# Patient Record
Sex: Female | Born: 1980 | Race: White | Hispanic: No | Marital: Single | Smoking: Current every day smoker
Health system: Southern US, Community
[De-identification: ages and names within clinical notes are randomized; demographics above are authoritative.]

## PROBLEM LIST (undated history)

## (undated) DIAGNOSIS — F32A Depression, unspecified: Secondary | ICD-10-CM

## (undated) DIAGNOSIS — F319 Bipolar disorder, unspecified: Secondary | ICD-10-CM

## (undated) DIAGNOSIS — F4310 Post-traumatic stress disorder, unspecified: Secondary | ICD-10-CM

## (undated) DIAGNOSIS — G5600 Carpal tunnel syndrome, unspecified upper limb: Secondary | ICD-10-CM

## (undated) DIAGNOSIS — N289 Disorder of kidney and ureter, unspecified: Secondary | ICD-10-CM

## (undated) DIAGNOSIS — N83209 Unspecified ovarian cyst, unspecified side: Secondary | ICD-10-CM

## (undated) DIAGNOSIS — F410 Panic disorder [episodic paroxysmal anxiety] without agoraphobia: Secondary | ICD-10-CM

## (undated) DIAGNOSIS — F209 Schizophrenia, unspecified: Secondary | ICD-10-CM

## (undated) DIAGNOSIS — F329 Major depressive disorder, single episode, unspecified: Secondary | ICD-10-CM

## (undated) HISTORY — DX: Major depressive disorder, single episode, unspecified: F32.9

## (undated) HISTORY — DX: Bipolar disorder, unspecified: F31.9

## (undated) HISTORY — DX: Depression, unspecified: F32.A

---

## 2001-06-16 ENCOUNTER — Inpatient Hospital Stay (HOSPITAL_COMMUNITY): Admission: EM | Admit: 2001-06-16 | Discharge: 2001-06-17 | Payer: Self-pay | Admitting: Emergency Medicine

## 2001-06-16 ENCOUNTER — Encounter: Payer: Self-pay | Admitting: Orthopaedic Surgery

## 2002-07-31 ENCOUNTER — Other Ambulatory Visit: Admission: RE | Admit: 2002-07-31 | Discharge: 2002-07-31 | Payer: Self-pay

## 2004-11-18 ENCOUNTER — Emergency Department (HOSPITAL_COMMUNITY): Admission: EM | Admit: 2004-11-18 | Discharge: 2004-11-18 | Payer: Self-pay | Admitting: Emergency Medicine

## 2005-02-26 ENCOUNTER — Emergency Department (HOSPITAL_COMMUNITY): Admission: EM | Admit: 2005-02-26 | Discharge: 2005-02-26 | Payer: Self-pay | Admitting: Emergency Medicine

## 2005-09-08 ENCOUNTER — Emergency Department (HOSPITAL_COMMUNITY): Admission: EM | Admit: 2005-09-08 | Discharge: 2005-09-08 | Payer: Self-pay | Admitting: Emergency Medicine

## 2008-01-26 ENCOUNTER — Ambulatory Visit (HOSPITAL_COMMUNITY): Admission: RE | Admit: 2008-01-26 | Discharge: 2008-01-26 | Payer: Self-pay | Admitting: Orthopaedic Surgery

## 2010-03-15 ENCOUNTER — Encounter: Payer: Self-pay | Admitting: Orthopaedic Surgery

## 2010-07-02 ENCOUNTER — Emergency Department (HOSPITAL_COMMUNITY)
Admission: EM | Admit: 2010-07-02 | Discharge: 2010-07-02 | Disposition: A | Discharge status: Home / Self Care | Payer: No Typology Code available for payment source | Attending: Emergency Medicine | Admitting: Emergency Medicine

## 2010-07-02 ENCOUNTER — Emergency Department (HOSPITAL_COMMUNITY): Payer: No Typology Code available for payment source

## 2010-07-02 DIAGNOSIS — M542 Cervicalgia: Secondary | ICD-10-CM | POA: Insufficient documentation

## 2010-07-02 DIAGNOSIS — Y92410 Unspecified street and highway as the place of occurrence of the external cause: Secondary | ICD-10-CM | POA: Insufficient documentation

## 2010-07-02 DIAGNOSIS — S99929A Unspecified injury of unspecified foot, initial encounter: Secondary | ICD-10-CM | POA: Insufficient documentation

## 2010-07-02 DIAGNOSIS — M25569 Pain in unspecified knee: Secondary | ICD-10-CM | POA: Insufficient documentation

## 2010-07-02 DIAGNOSIS — S8990XA Unspecified injury of unspecified lower leg, initial encounter: Secondary | ICD-10-CM | POA: Insufficient documentation

## 2010-07-02 DIAGNOSIS — IMO0002 Reserved for concepts with insufficient information to code with codable children: Secondary | ICD-10-CM | POA: Insufficient documentation
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
## 2010-07-21 ENCOUNTER — Emergency Department (HOSPITAL_COMMUNITY)
Admission: EM | Admit: 2010-07-21 | Discharge: 2010-07-21 | Disposition: A | Discharge status: Home / Self Care | Payer: No Typology Code available for payment source | Attending: Emergency Medicine | Admitting: Emergency Medicine

## 2010-07-21 DIAGNOSIS — M5412 Radiculopathy, cervical region: Secondary | ICD-10-CM | POA: Insufficient documentation

## 2010-07-22 ENCOUNTER — Ambulatory Visit (HOSPITAL_COMMUNITY)
Admit: 2010-07-22 | Discharge: 2010-07-22 | Disposition: A | Discharge status: Home / Self Care | Payer: Medicaid Other | Source: Ambulatory Visit | Attending: Emergency Medicine | Admitting: Emergency Medicine

## 2010-07-22 DIAGNOSIS — M25519 Pain in unspecified shoulder: Secondary | ICD-10-CM | POA: Insufficient documentation

## 2010-07-22 DIAGNOSIS — M5020 Other cervical disc displacement, unspecified cervical region: Secondary | ICD-10-CM | POA: Insufficient documentation

## 2010-07-22 DIAGNOSIS — M542 Cervicalgia: Secondary | ICD-10-CM | POA: Insufficient documentation

## 2010-07-23 ENCOUNTER — Other Ambulatory Visit (HOSPITAL_COMMUNITY): Payer: No Typology Code available for payment source

## 2011-02-10 ENCOUNTER — Encounter: Payer: Self-pay | Admitting: *Deleted

## 2011-02-10 ENCOUNTER — Emergency Department (HOSPITAL_COMMUNITY)
Admission: EM | Admit: 2011-02-10 | Discharge: 2011-02-10 | Disposition: A | Discharge status: Home / Self Care | Payer: No Typology Code available for payment source | Attending: Emergency Medicine | Admitting: Emergency Medicine

## 2011-02-10 DIAGNOSIS — IMO0002 Reserved for concepts with insufficient information to code with codable children: Secondary | ICD-10-CM | POA: Insufficient documentation

## 2011-02-10 DIAGNOSIS — M542 Cervicalgia: Secondary | ICD-10-CM | POA: Insufficient documentation

## 2011-02-10 DIAGNOSIS — M25519 Pain in unspecified shoulder: Secondary | ICD-10-CM | POA: Insufficient documentation

## 2011-02-10 DIAGNOSIS — F411 Generalized anxiety disorder: Secondary | ICD-10-CM | POA: Insufficient documentation

## 2011-02-10 DIAGNOSIS — M79609 Pain in unspecified limb: Secondary | ICD-10-CM | POA: Insufficient documentation

## 2011-02-10 DIAGNOSIS — M5412 Radiculopathy, cervical region: Secondary | ICD-10-CM | POA: Insufficient documentation

## 2011-02-10 DIAGNOSIS — F17200 Nicotine dependence, unspecified, uncomplicated: Secondary | ICD-10-CM | POA: Insufficient documentation

## 2011-02-10 NOTE — ED Provider Notes (Signed)
History     CSN: [id number] Arrival date & time: 02/10/2011 10:05 AM   First MD Initiated Contact with Patient 02/10/11 [phone]      Chief Complaint  Patient presents with  . Hand Pain    (Consider location/radiation/quality/duration/timing/severity/associated sxs/prior treatment) Patient is a 30 y.o. female presenting with hand pain. The history is provided by the patient.  Hand Pain    History reviewed. No pertinent past medical history.  History reviewed. No pertinent past surgical history.  No family history on file.  History  Substance Use Topics  . Smoking status: Current Everyday Smoker    Types: Cigarettes  . Smokeless tobacco: Not on file  . Alcohol Use: No    OB History    Grav Para Term Preterm Abortions TAB SAB Ect Mult Living                  Review of Systems  Allergies  Review of patient's allergies indicates no known allergies.  Home Medications  No current outpatient prescriptions on file.  BP 112/76  Pulse 111  Temp(Src) 98 F (36.7 C) (Oral)  Resp 16  Ht 5\' 4"  (1.626 m)  Wt 160 lb (72.576 kg)  BMI 27.46 kg/m2  SpO2 99%  LMP 01/27/2011  Physical Exam  Nursing note and vitals reviewed. Constitutional: She is oriented to person, place, and time. She appears well-developed and well-nourished.  Non-toxic appearance.  HENT:  Head: Normocephalic.  Right Ear: Tympanic membrane and external ear normal.  Left Ear: Tympanic membrane and external ear normal.  Eyes: EOM and lids are normal. Pupils are equal, round, and reactive to light.  Neck: Normal range of motion. Neck supple. Carotid bruit is not present.       Soreness with range of motion of the neck extending into the shoulders.  Cardiovascular: Normal rate, regular rhythm, normal heart sounds, intact distal pulses and normal pulses.   Pulmonary/Chest: Breath sounds normal. No respiratory distress.  Abdominal: Soft. Bowel sounds are normal. There is no tenderness. There is no guarding.   Musculoskeletal:       Soreness with attempted range of motion of the shoulders  Lymphadenopathy:       Head (right side): No submandibular adenopathy present.       Head (left side): No submandibular adenopathy present.    She has no cervical adenopathy.  Neurological: She is alert and oriented to person, place, and time. She has normal strength. No cranial nerve deficit or sensory deficit.       No gross neuro deficits appreciated at this time.  Skin: Skin is warm and dry.  Psychiatric: Her speech is normal.       Mildly agitated. Anxious    ED Course  Procedures (including critical care time)  Labs Reviewed - No data to display No results found.   No diagnosis found.    MDM  Patient reassured of stable vital signs. Reassured of stable examination at this time. Without gross neurologic deficits. Patient strongly encouraged to see a orthopedic specialist or neurosurgeon who can help her make the decisions for which procedures to have done next. Patient to continue current medications.    Kathie Dike, [place] 02/11/11 [phone]

## 2011-02-10 NOTE — ED Notes (Signed)
MD at bedside. 
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
## 2011-02-17 NOTE — ED Provider Notes (Signed)
Medical screening examination/treatment/procedure(s) were performed by non-physician practitioner and as supervising physician I was immediately available for consultation/collaboration.  Mallorie Norrod, MD 02/17/11 0130 

## 2011-05-28 ENCOUNTER — Encounter (HOSPITAL_COMMUNITY): Payer: Self-pay

## 2011-05-28 ENCOUNTER — Emergency Department (HOSPITAL_COMMUNITY)
Admission: EM | Admit: 2011-05-28 | Discharge: 2011-05-29 | Disposition: A | Discharge status: Home / Self Care | Payer: Medicaid Other | Attending: Emergency Medicine | Admitting: Emergency Medicine

## 2011-05-28 DIAGNOSIS — Z79899 Other long term (current) drug therapy: Secondary | ICD-10-CM | POA: Insufficient documentation

## 2011-05-28 DIAGNOSIS — R109 Unspecified abdominal pain: Secondary | ICD-10-CM

## 2011-05-28 LAB — URINALYSIS, ROUTINE W REFLEX MICROSCOPIC
Bilirubin Urine: NEGATIVE
Hgb urine dipstick: NEGATIVE
Ketones, ur: NEGATIVE mg/dL
Nitrite: NEGATIVE
pH: 6 (ref 5.0–8.0)

## 2011-05-28 NOTE — ED Notes (Signed)
Pt presents with bilateral flank pain that radiates to abdomen. Pt also states she feels like something is scratching her when she cleans herself after using the restroom.

## 2011-05-29 ENCOUNTER — Emergency Department (HOSPITAL_COMMUNITY): Payer: Medicaid Other

## 2011-05-29 MED ORDER — HYDROCODONE-ACETAMINOPHEN 5-325 MG PO TABS
2.0000 | ORAL_TABLET | Freq: Once | ORAL | Status: AC
  Administered 2011-05-29: 2 via ORAL
  Filled 2011-05-29 (×2): qty 2

## 2011-05-29 MED ORDER — ONDANSETRON 4 MG PO TBDP
4.0000 mg | ORAL_TABLET | Freq: Once | ORAL | Status: AC
  Administered 2011-05-29: 4 mg via ORAL
  Filled 2011-05-29: qty 1

## 2011-05-29 MED ORDER — HYDROCODONE-ACETAMINOPHEN 5-500 MG PO TABS: 1.0000 | ORAL_TABLET | Freq: Four times a day (QID) | ORAL | Status: AC | PRN

## 2011-05-29 MED ORDER — ONDANSETRON 4 MG PO TBDP
ORAL_TABLET | ORAL | Status: AC
  Filled 2011-05-29: qty 1

## 2011-05-29 MED ORDER — KETOROLAC TROMETHAMINE 60 MG/2ML IM SOLN
60.0000 mg | Freq: Once | INTRAMUSCULAR | Status: AC
  Administered 2011-05-29: 60 mg via INTRAMUSCULAR
  Filled 2011-05-29: qty 2

## 2011-05-29 NOTE — ED Notes (Signed)
edp in to see pt 

## 2011-05-29 NOTE — Discharge Instructions (Signed)

## 2011-05-29 NOTE — ED Provider Notes (Signed)
History     CSN: [id number]  Arrival date & time 05/28/11  2246   First MD Initiated Contact with Patient 05/28/11 2348      Chief Complaint  Patient presents with  . Flank Pain    (Consider location/radiation/quality/duration/timing/severity/associated sxs/prior treatment) HPI Comments: Pain is in the left flank and lower back.  Concerned she may have a kidney stone.  Her mom gets these.  No vaginal discharge.  LMP last month and normal, denies possibility of pregnancy.  Patient is a 31 y.o. female presenting with flank pain. The history is provided by the patient.  Flank Pain This is a new problem. The current episode started 2 days ago. The problem occurs constantly. The problem has been gradually worsening. Associated symptoms include abdominal pain. Pertinent negatives include no chest pain. The symptoms are aggravated by nothing. The symptoms are relieved by nothing.    History reviewed. No pertinent past medical history.  History reviewed. No pertinent past surgical history.  No family history on file.  History  Substance Use Topics  . Smoking status: Current Everyday Smoker    Types: Cigarettes  . Smokeless tobacco: Not on file  . Alcohol Use: No    OB History    Grav Para Term Preterm Abortions TAB SAB Ect Mult Living                  Review of Systems  Cardiovascular: Negative for chest pain.  Gastrointestinal: Positive for abdominal pain.  Genitourinary: Positive for flank pain.  All other systems reviewed and are negative.    Allergies  Review of patient's allergies indicates no known allergies.  Home Medications   Current Outpatient Rx  Name Route Sig Dispense Refill  . AMPHETAMINE-DEXTROAMPHETAMINE 20 MG PO TABS Oral Take 20 mg by mouth 2 (two) times daily.      Marland Kitchen DIAZEPAM 10 MG PO TABS Oral Take 10 mg by mouth every 6 (six) hours as needed. Anxiety     . FLUOXETINE HCL 20 MG PO CAPS Oral Take 20 mg by mouth daily.      Marland Kitchen  HYDROCODONE-ACETAMINOPHEN 7.5-325 MG PO TABS Oral Take 1 tablet by mouth every 6 (six) hours as needed. Pain     . ZIPRASIDONE HCL 20 MG PO CAPS Oral Take 20 mg by mouth daily.        BP 103/62  Pulse 100  Temp(Src) 98.1 F (36.7 C) (Oral)  Resp 20  Ht 5\' 6"  (1.676 m)  Wt 160 lb (72.576 kg)  BMI 25.82 kg/m2  SpO2 100%  LMP 05/03/2011  Physical Exam  Nursing note and vitals reviewed. Constitutional: She is oriented to person, place, and time. She appears well-developed and well-nourished. No distress.  HENT:  Head: Normocephalic and atraumatic.  Neck: Normal range of motion. Neck supple.  Cardiovascular: Normal rate and regular rhythm.   No murmur heard. Pulmonary/Chest: Effort normal and breath sounds normal. No respiratory distress.  Abdominal: Soft. Bowel sounds are normal. She exhibits no distension. There is tenderness.       There is mild ttp in the left upper and lower abdomen.  Mild left cva ttp.  Musculoskeletal: Normal range of motion. She exhibits no edema.  Neurological: She is alert and oriented to person, place, and time.  Skin: Skin is warm and dry. She is not diaphoretic.    ED Course  Procedures (including critical care time)   Labs Reviewed  URINALYSIS, ROUTINE W REFLEX MICROSCOPIC  PREGNANCY, URINE   No  results found.   No diagnosis found.    MDM  The ua and ct look okay.  I have not found an exact cause for the patient's symptoms, but they do not seem emergent.  Will discharge with pain meds, return prn.  She reports issues with her ovaries which I have advised her to see Gyn in follow up.          Geoffery Lyons, MD 05/29/11 0201

## 2011-11-04 ENCOUNTER — Encounter (HOSPITAL_COMMUNITY): Payer: Self-pay

## 2011-11-04 ENCOUNTER — Emergency Department (HOSPITAL_COMMUNITY)
Admission: EM | Admit: 2011-11-04 | Discharge: 2011-11-04 | Disposition: A | Discharge status: Home / Self Care | Payer: Medicaid Other | Attending: Emergency Medicine | Admitting: Emergency Medicine

## 2011-11-04 DIAGNOSIS — H6692 Otitis media, unspecified, left ear: Secondary | ICD-10-CM

## 2011-11-04 DIAGNOSIS — H6690 Otitis media, unspecified, unspecified ear: Secondary | ICD-10-CM | POA: Insufficient documentation

## 2011-11-04 DIAGNOSIS — F17200 Nicotine dependence, unspecified, uncomplicated: Secondary | ICD-10-CM | POA: Insufficient documentation

## 2011-11-04 LAB — URINALYSIS, ROUTINE W REFLEX MICROSCOPIC
Glucose, UA: NEGATIVE mg/dL
Leukocytes, UA: NEGATIVE
Protein, ur: NEGATIVE mg/dL
Specific Gravity, Urine: >1.03 — ABNORMAL HIGH (ref 1.005–1.030)
pH: 6 (ref 5.0–8.0)

## 2011-11-04 LAB — URINE MICROSCOPIC-ADD ON

## 2011-11-04 LAB — PREGNANCY, URINE: Preg Test, Ur: NEGATIVE

## 2011-11-04 MED ORDER — FLUCONAZOLE 200 MG PO TABS: 200.0000 mg | ORAL_TABLET | Freq: Every day | ORAL | Status: AC

## 2011-11-04 MED ORDER — AMOXICILLIN 500 MG PO CAPS: 500.0000 mg | ORAL_CAPSULE | Freq: Three times a day (TID) | ORAL | Status: AC

## 2011-11-04 MED ORDER — AMOXICILLIN 250 MG PO CAPS
500.0000 mg | ORAL_CAPSULE | Freq: Once | ORAL | Status: AC
  Administered 2011-11-04: 500 mg via ORAL
  Filled 2011-11-04: qty 2

## 2011-11-04 NOTE — ED Notes (Signed)
Pt also reports that she "only spotted a few days last month" and wants apregnancy test.

## 2011-11-04 NOTE — ED Provider Notes (Signed)
History     CSN: [id number]  Arrival date & time 11/04/11  1035   First MD Initiated Contact with Patient 11/04/11 1057      Chief Complaint  Patient presents with  . Otalgia  . Dysuria    (Consider location/radiation/quality/duration/timing/severity/associated sxs/prior treatment) HPI Comments: L ear pain x 2 days and "difficulty urinating" x 3 days.  No fever or chills.  No dysuria.  ? Hematuria.  "ready to start my period".  No frequency.  No urgency.  + hesitation.  No back.  Patient is a 31 y.o. female presenting with ear pain and dysuria. The history is provided by the patient. No language interpreter was used.  Otalgia This is a new problem. The current episode started 2 days ago. There is pain in the left ear. The problem occurs constantly. The problem has not changed since onset.There has been no fever. Pertinent negatives include no ear discharge, no headaches, no hearing loss, no sore throat, no cough and no rash. Her past medical history does not include chronic ear infection, hearing loss or tympanostomy tube.  Dysuria  Pertinent negatives include no chills, no frequency, no hematuria and no urgency.    History reviewed. No pertinent past medical history.  History reviewed. No pertinent past surgical history.  No family history on file.  History  Substance Use Topics  . Smoking status: Current Every Day Smoker    Types: Cigarettes  . Smokeless tobacco: Not on file  . Alcohol Use: No    OB History    Grav Para Term Preterm Abortions TAB SAB Ect Mult Living                  Review of Systems  Constitutional: Negative for fever and chills.  HENT: Positive for ear pain. Negative for hearing loss, sore throat and ear discharge.   Respiratory: Negative for cough.   Genitourinary: Positive for vaginal bleeding. Negative for dysuria, urgency, frequency, hematuria, decreased urine volume, vaginal discharge, vaginal pain and pelvic pain.  Musculoskeletal: Negative  for back pain.  Skin: Negative for rash.  Neurological: Negative for headaches.  All other systems reviewed and are negative.    Allergies  Review of patient's allergies indicates no known allergies.  Home Medications   Current Outpatient Rx  Name Route Sig Dispense Refill  . AMOXICILLIN 500 MG PO CAPS Oral Take 1 capsule (500 mg total) by mouth 3 (three) times daily. 30 capsule 0  . AMPHETAMINE-DEXTROAMPHETAMINE 20 MG PO TABS Oral Take 20 mg by mouth 2 (two) times daily.      Marland Kitchen DIAZEPAM 10 MG PO TABS Oral Take 10 mg by mouth every 6 (six) hours as needed. Anxiety     . FLUCONAZOLE 200 MG PO TABS Oral Take 1 tablet (200 mg total) by mouth daily. 1 tablet 1  . FLUOXETINE HCL 20 MG PO CAPS Oral Take 20 mg by mouth daily.      Marland Kitchen HYDROCODONE-ACETAMINOPHEN 7.5-325 MG PO TABS Oral Take 1 tablet by mouth every 6 (six) hours as needed. Pain     . ZIPRASIDONE HCL 20 MG PO CAPS Oral Take 20 mg by mouth daily.        BP 119/79  Pulse 98  Temp 97.7 F (36.5 C) (Oral)  Resp 18  SpO2 99%  LMP 10/13/2011  Physical Exam  Nursing note and vitals reviewed. Constitutional: She is oriented to person, place, and time. She appears well-developed and well-nourished. No distress.  HENT:  Head: Normocephalic  and atraumatic.  Right Ear: Hearing, tympanic membrane, external ear and ear canal normal.  Left Ear: Hearing, external ear and ear canal normal. No drainage. Tympanic membrane is erythematous and bulging. A middle ear effusion is present. No decreased hearing is noted.  Eyes: EOM are normal.  Neck: Normal range of motion.  Cardiovascular: Normal rate, regular rhythm and normal heart sounds.   Pulmonary/Chest: Effort normal and breath sounds normal.  Abdominal: Soft. Normal appearance. She exhibits no distension. There is no tenderness. There is no CVA tenderness.  Musculoskeletal: Normal range of motion.  Neurological: She is alert and oriented to person, place, and time.  Skin: Skin is warm  and dry.  Psychiatric: She has a normal mood and affect. Judgment normal.    ED Course  Procedures (including critical care time)  Labs Reviewed  URINALYSIS, ROUTINE W REFLEX MICROSCOPIC - Abnormal; Notable for the following:    Specific Gravity, Urine >1.030 (*)     Hgb urine dipstick TRACE (*)     All other components within normal limits  URINE MICROSCOPIC-ADD ON - Abnormal; Notable for the following:    Squamous Epithelial / LPF FEW (*)     Crystals CA OXALATE CRYSTALS (*)     All other components within normal limits  PREGNANCY, URINE   No results found.   1. Left otitis media       MDM  rx-amoxicillin 500 mg TID x 10 day rx-fluconazole 200 mg, 1 tab        Evalina Field, PA 11/04/11 1143

## 2011-11-04 NOTE — ED Notes (Signed)
Pt states she has had an ear ache , and URI symptoms x 3 days.  Nasal congestion audible with noted clear drainage. Pt denies fever and cough at this time. Pt also requests pain medication at this time. Pt was advised to take motrin 800 mg 3 x's a day with food. Pt states needs something stronger. Advised pt to see family MD if stronger medication is needed.

## 2011-11-04 NOTE — ED Notes (Signed)
1. Pt reports left earache for 2 days 2. Painful urination for 3 days.

## 2011-11-04 NOTE — ED Provider Notes (Signed)
Medical screening examination/treatment/procedure(s) were performed by non-physician practitioner and as supervising physician I was immediately available for consultation/collaboration.   Nami Strawder L Fate Caster, MD 11/04/11 1451 

## 2011-12-20 ENCOUNTER — Encounter (HOSPITAL_COMMUNITY): Payer: Self-pay

## 2011-12-20 ENCOUNTER — Emergency Department (HOSPITAL_COMMUNITY)
Admission: EM | Admit: 2011-12-20 | Discharge: 2011-12-21 | Disposition: A | Discharge status: Home / Self Care | Payer: Medicaid Other | Attending: Emergency Medicine | Admitting: Emergency Medicine

## 2011-12-20 ENCOUNTER — Emergency Department (HOSPITAL_COMMUNITY): Payer: Medicaid Other

## 2011-12-20 DIAGNOSIS — Z8742 Personal history of other diseases of the female genital tract: Secondary | ICD-10-CM | POA: Insufficient documentation

## 2011-12-20 DIAGNOSIS — Z79899 Other long term (current) drug therapy: Secondary | ICD-10-CM | POA: Insufficient documentation

## 2011-12-20 DIAGNOSIS — N949 Unspecified condition associated with female genital organs and menstrual cycle: Secondary | ICD-10-CM | POA: Insufficient documentation

## 2011-12-20 DIAGNOSIS — N72 Inflammatory disease of cervix uteri: Secondary | ICD-10-CM | POA: Insufficient documentation

## 2011-12-20 DIAGNOSIS — G5600 Carpal tunnel syndrome, unspecified upper limb: Secondary | ICD-10-CM | POA: Insufficient documentation

## 2011-12-20 DIAGNOSIS — F17200 Nicotine dependence, unspecified, uncomplicated: Secondary | ICD-10-CM | POA: Insufficient documentation

## 2011-12-20 DIAGNOSIS — R102 Pelvic and perineal pain: Secondary | ICD-10-CM

## 2011-12-20 HISTORY — DX: Carpal tunnel syndrome, unspecified upper limb: G56.00

## 2011-12-20 HISTORY — DX: Unspecified ovarian cyst, unspecified side: N83.209

## 2011-12-20 LAB — URINALYSIS, ROUTINE W REFLEX MICROSCOPIC
Bilirubin Urine: NEGATIVE
Glucose, UA: NEGATIVE mg/dL
Hgb urine dipstick: NEGATIVE
Ketones, ur: NEGATIVE mg/dL
Protein, ur: NEGATIVE mg/dL
Urobilinogen, UA: 0.2 mg/dL (ref 0.0–1.0)

## 2011-12-20 LAB — WET PREP, GENITAL
Clue Cells Wet Prep HPF POC: NONE SEEN
Trich, Wet Prep: NONE SEEN
Yeast Wet Prep HPF POC: NONE SEEN

## 2011-12-20 MED ORDER — KETOROLAC TROMETHAMINE 60 MG/2ML IM SOLN
60.0000 mg | Freq: Once | INTRAMUSCULAR | Status: AC
  Administered 2011-12-20: 60 mg via INTRAMUSCULAR
  Filled 2011-12-20: qty 2

## 2011-12-20 MED ORDER — CEFTRIAXONE SODIUM 250 MG IJ SOLR
250.0000 mg | Freq: Once | INTRAMUSCULAR | Status: AC
  Administered 2011-12-20: 250 mg via INTRAMUSCULAR
  Filled 2011-12-20: qty 250

## 2011-12-20 MED ORDER — AZITHROMYCIN 250 MG PO TABS
1000.0000 mg | ORAL_TABLET | Freq: Once | ORAL | Status: AC
  Administered 2011-12-20: 1000 mg via ORAL
  Filled 2011-12-20: qty 4

## 2011-12-20 MED ORDER — HYDROCODONE-ACETAMINOPHEN 5-325 MG PO TABS: ORAL_TABLET | ORAL | Status: DC

## 2011-12-20 MED ORDER — ONDANSETRON 8 MG PO TBDP
8.0000 mg | ORAL_TABLET | Freq: Once | ORAL | Status: AC
  Administered 2011-12-20: 8 mg via ORAL
  Filled 2011-12-20: qty 1

## 2011-12-20 MED ORDER — NAPROXEN 250 MG PO TABS: 250.0000 mg | ORAL_TABLET | Freq: Two times a day (BID) | ORAL | Status: DC

## 2011-12-20 MED ORDER — LIDOCAINE HCL (PF) 1 % IJ SOLN
INTRAMUSCULAR | Status: AC
  Administered 2011-12-20: 5 mL
  Filled 2011-12-20: qty 5

## 2011-12-20 NOTE — ED Notes (Signed)
Having ovarian cyst problems and hurting in my lower pelvic area. Having an odor and some urinary problems per pt.

## 2011-12-20 NOTE — ED Provider Notes (Signed)
History     CSN: [id number]  Arrival date & time 12/20/11  1851   First MD Initiated Contact with Patient 12/20/11 1955      Chief Complaint  Patient presents with  . Ovarian Cyst  . Pelvic Pain     HPI Pt was seen at 2010.  Per pt, c/o gradual onset and persistence of constant lower pelvic "pain" for the past 2 to 3 weeks.  Has been associated with "green" vaginal discharge "with an odor."  Denies flank pain, no fevers, no N/V/D, no rash, no dysuria, no vaginal bleeding, no CP/SOB.    Past Medical History  Diagnosis Date  . Ovarian cyst   . Ovarian cyst rupture   . Carpal tunnel syndrome     History reviewed. No pertinent past surgical history.   History  Substance Use Topics  . Smoking status: Current Every Day Smoker -- 0.5 packs/day    Types: Cigarettes  . Smokeless tobacco: Not on file  . Alcohol Use: No    Review of Systems ROS: Statement: All systems negative except as marked or noted in the HPI; Constitutional: Negative for fever and chills. ; ; Eyes: Negative for eye pain, redness and discharge. ; ; ENMT: Negative for ear pain, hoarseness, nasal congestion, sinus pressure and sore throat. ; ; Cardiovascular: Negative for chest pain, palpitations, diaphoresis, dyspnea and peripheral edema. ; ; Respiratory: Negative for cough, wheezing and stridor. ; ; Gastrointestinal: Negative for nausea, vomiting, diarrhea, abdominal pain, blood in stool, hematemesis, jaundice and rectal bleeding. . ; ; Genitourinary: Negative for dysuria, flank pain and hematuria. ; GYN:  No vaginal bleeding, +pelvic pain, +vaginal discharge, no vulvar pain.; ; Musculoskeletal: Negative for back pain and neck pain. Negative for swelling and trauma.; ; Skin: Negative for pruritus, rash, abrasions, blisters, bruising and skin lesion.; ; Neuro: Negative for headache, lightheadedness and neck stiffness. Negative for weakness, altered level of consciousness , altered mental status, extremity weakness,  paresthesias, involuntary movement, seizure and syncope.       Allergies  Review of patient's allergies indicates no known allergies.  Home Medications   Current Outpatient Rx  Name Route Sig Dispense Refill  . AMPHETAMINE-DEXTROAMPHETAMINE 20 MG PO TABS Oral Take 20 mg by mouth 2 (two) times daily.      Marland Kitchen DIAZEPAM 10 MG PO TABS Oral Take 10 mg by mouth every 6 (six) hours as needed. Anxiety     . FLUOXETINE HCL 20 MG PO CAPS Oral Take 20 mg by mouth daily.      Marland Kitchen HYDROCODONE-ACETAMINOPHEN 7.5-325 MG PO TABS Oral Take 1 tablet by mouth every 6 (six) hours as needed. Pain     . ZIPRASIDONE HCL 20 MG PO CAPS Oral Take 20 mg by mouth daily.        BP 130/69  Pulse 100  Temp 98.2 F (36.8 C) (Oral)  Resp 20  Ht 5\' 6"  (1.676 m)  Wt 150 lb (68.04 kg)  BMI 24.21 kg/m2  SpO2 100%  LMP 09/29/2011  Physical Exam 2015: Physical examination:  Nursing notes reviewed; Vital signs and O2 SAT reviewed;  Constitutional: Well developed, Well nourished, Well hydrated, In no acute distress; Head:  Normocephalic, atraumatic; Eyes: EOMI, PERRL, No scleral icterus; ENMT: Mouth and pharynx normal, Mucous membranes moist; Neck: Supple, Full range of motion, No lymphadenopathy; Cardiovascular: Regular rate and rhythm, No murmur, rub, or gallop; Respiratory: Breath sounds clear & equal bilaterally, No rales, rhonchi, wheezes.  Speaking full sentences with ease, Normal respiratory  effort/excursion; Chest: Nontender, Movement normal; Abdomen: Soft, +LLQ, suprapubic, RLQ tender to palp. No rebound or guarding. Nondistended, Normal bowel sounds; Genitourinary: No CVA tenderness. Pelvic exam performed with permission of pt and female ED tech assist during exam.  External genitalia w/o lesions. Vaginal vault with thick while discharge, no purulence.  Cervix w/o lesions, not friable, GC/chlam and wet prep obtained and sent to lab.  Bimanual exam w/o CMT or adnexal tenderness, +suprapubic tenderness.;;; Extremities:  Pulses normal, No tenderness, No edema, No calf edema or asymmetry.; Neuro: AA&Ox3, Major CN grossly intact.  Speech clear. No gross focal motor or sensory deficits in extremities.; Skin: Color normal, Warm, Dry.   ED Course  Procedures    MDM  MDM Reviewed: nursing note, vitals and previous chart Interpretation: labs and ultrasound   Results for orders placed during the hospital encounter of 12/20/11  URINALYSIS, ROUTINE W REFLEX MICROSCOPIC      Component Value Range   Color, Urine YELLOW  YELLOW   APPearance CLEAR  CLEAR   Specific Gravity, Urine 1.020  1.005 - 1.030   pH 6.5  5.0 - 8.0   Glucose, UA NEGATIVE  NEGATIVE mg/dL   Hgb urine dipstick NEGATIVE  NEGATIVE   Bilirubin Urine NEGATIVE  NEGATIVE   Ketones, ur NEGATIVE  NEGATIVE mg/dL   Protein, ur NEGATIVE  NEGATIVE mg/dL   Urobilinogen, UA 0.2  0.0 - 1.0 mg/dL   Nitrite NEGATIVE  NEGATIVE   Leukocytes, UA NEGATIVE  NEGATIVE  PREGNANCY, URINE      Component Value Range   Preg Test, Ur NEGATIVE  NEGATIVE  WET PREP, GENITAL      Component Value Range   Yeast Wet Prep HPF POC NONE SEEN  NONE SEEN   Trich, Wet Prep NONE SEEN  NONE SEEN   Clue Cells Wet Prep HPF POC NONE SEEN  NONE SEEN   WBC, Wet Prep HPF POC RARE (*) NONE SEEN    US Pelvis Complete 12/20/2011  *RADIOLOGY REPORT*  Clinical Data:  Pelvic pain.  Rule out abscess or torsion.  TRANSABDOMINAL AND TRANSVAGINAL ULTRASOUND OF PELVIS DOPPLER ULTRASOUND OF OVARIES  Technique:  Both transabdominal and transvaginal ultrasound examinations of the pelvis were performed. Transabdominal technique was performed for global imaging of the pelvis including uterus, ovaries, adnexal regions, and pelvic cul-de-sac.  It was necessary to proceed with endovaginal exam following the transabdominal exam to visualize the ovaries.  Color and duplex Doppler ultrasound was utilized to evaluate blood flow to the ovaries.  Comparison:  CT of the abdomen pelvis 05/29/2011 at [facility].  Findings:  Uterus:  The uterus is of normal size and echotexture measuring 8.2 x 4.2 x 4.8 cm.  No significant mass lesion is present.  Endometrium:  Endometrium is within normal limits measuring 4 mm. Subendometrial cyst measures 5 mm.  Right ovary: The right ovary is of normal size and echotexture measuring 3.9 x 1.4 x 3.7 cm.  Normal color Doppler flow is present.  Arterial and venous waveforms are normal.  Left ovary:   The left ovary is of normal size and echotexture, measuring 3.0 x 2.2 x 2.8 cm.  Normal color Doppler flow is present.  Arterial venous waveforms are normal.  Pulsed Doppler evaluation demonstrates normal low-resistance arterial and venous waveforms in both ovaries.  A small amount of free fluid is present adjacent to the lower uterine segment.  IMPRESSION:  1.  Normal appearance of the uterus and ovaries. 2.  Normal appearance of the  follicles bilaterally. 3.  Normal color Doppler analysis and wave forms without evidence for ovarian torsion. 4.  A small amount of free fluid is evident.   Original Report Authenticated By: Jamesetta Orleans. MATTERN, M.D.       2330:  No acute findings on workup today.  Will treat for cervicitis while GC/chlam pending.  Dx and testing d/w pt and family.  Questions answered.  Verb understanding, agreeable to d/c home with outpt f/u.       Laray Anger, DO 12/23/11 2056

## 2012-02-11 ENCOUNTER — Emergency Department (HOSPITAL_COMMUNITY)
Admission: EM | Admit: 2012-02-11 | Discharge: 2012-02-11 | Disposition: A | Discharge status: Home / Self Care | Payer: Medicaid Other | Attending: Emergency Medicine | Admitting: Emergency Medicine

## 2012-02-11 ENCOUNTER — Encounter (HOSPITAL_COMMUNITY): Payer: Self-pay | Admitting: *Deleted

## 2012-02-11 DIAGNOSIS — F319 Bipolar disorder, unspecified: Secondary | ICD-10-CM | POA: Insufficient documentation

## 2012-02-11 DIAGNOSIS — Z79899 Other long term (current) drug therapy: Secondary | ICD-10-CM | POA: Insufficient documentation

## 2012-02-11 DIAGNOSIS — G5600 Carpal tunnel syndrome, unspecified upper limb: Secondary | ICD-10-CM | POA: Insufficient documentation

## 2012-02-11 DIAGNOSIS — F17200 Nicotine dependence, unspecified, uncomplicated: Secondary | ICD-10-CM | POA: Insufficient documentation

## 2012-02-11 DIAGNOSIS — R109 Unspecified abdominal pain: Secondary | ICD-10-CM | POA: Insufficient documentation

## 2012-02-11 DIAGNOSIS — Z8742 Personal history of other diseases of the female genital tract: Secondary | ICD-10-CM | POA: Insufficient documentation

## 2012-02-11 DIAGNOSIS — N390 Urinary tract infection, site not specified: Secondary | ICD-10-CM

## 2012-02-11 HISTORY — DX: Bipolar disorder, unspecified: F31.9

## 2012-02-11 LAB — URINE MICROSCOPIC-ADD ON

## 2012-02-11 LAB — URINALYSIS, ROUTINE W REFLEX MICROSCOPIC
Glucose, UA: 250 mg/dL — AB
Specific Gravity, Urine: 1.025 (ref 1.005–1.030)
pH: 5 (ref 5.0–8.0)

## 2012-02-11 MED ORDER — CIPROFLOXACIN HCL 500 MG PO TABS: 500.0000 mg | ORAL_TABLET | Freq: Two times a day (BID) | ORAL | Status: DC

## 2012-02-11 MED ORDER — OXYCODONE-ACETAMINOPHEN 5-325 MG PO TABS
2.0000 | ORAL_TABLET | Freq: Once | ORAL | Status: AC
  Administered 2012-02-11: 2 via ORAL
  Filled 2012-02-11: qty 2

## 2012-02-11 MED ORDER — OXYCODONE-ACETAMINOPHEN 5-325 MG PO TABS: 1.0000 | ORAL_TABLET | Freq: Four times a day (QID) | ORAL | Status: DC | PRN

## 2012-02-11 NOTE — ED Provider Notes (Signed)
History  This chart was scribed for Geoffery Lyons, MD by Bennett Scrape, ED Scribe. This patient was seen in room APA03/APA03 and the patient's care was started at 4:05 PM.  CSN: [id number]  Arrival date & time 02/11/12  1519   First MD Initiated Contact with Patient 02/11/12 1605      Chief Complaint  Patient presents with  . Dysuria  . Flank Pain     The history is provided by the patient. No language interpreter was used.   Martha Cochran is a 31 y.o. female who presents to the Emergency Department complaining of 4 to 5 days of gradual onset, gradually worsening, constant right flank pain that radiates into her bilateral suprapubic region with associated difficulty urinating, dysuria described pressure and night sweats. She rates her pain as severe. She reports taking her boyfriend's oxycodone with mild improvement in her pain. She denies fevers, nausea and emesis as associated symptoms. She has a h/o bipolar disorder, ovarian cysts and carpal tunnel. She is a current everyday smoker but denies alcohol use.  Past Medical History  Diagnosis Date  . Ovarian cyst   . Ovarian cyst rupture   . Carpal tunnel syndrome   . Bipolar 1 disorder     History reviewed. No pertinent past surgical history.  No family history on file.  History  Substance Use Topics  . Smoking status: Current Every Day Smoker -- 0.5 packs/day    Types: Cigarettes  . Smokeless tobacco: Not on file  . Alcohol Use: No    No OB history provided.   Review of Systems  Constitutional: Negative for fever and chills.  Cardiovascular: Negative for chest pain.  Gastrointestinal: Negative for nausea, vomiting, abdominal pain and diarrhea.  Genitourinary: Positive for dysuria, flank pain and difficulty urinating. Negative for frequency and hematuria.  All other systems reviewed and are negative.    Allergies  Review of patient's allergies indicates no known allergies.  Home Medications   Current  Outpatient Rx  Name  Route  Sig  Dispense  Refill  . ALBUTEROL SULFATE HFA 108 (90 BASE) MCG/ACT IN AERS   Inhalation   Inhale 2 puffs into the lungs every 6 (six) hours as needed. For shortness of breath         . AMPHETAMINE-DEXTROAMPHETAMINE 20 MG PO TABS   Oral   Take 20 mg by mouth 2 (two) times daily.           Marland Kitchen DIAZEPAM 10 MG PO TABS   Oral   Take 10 mg by mouth every 6 (six) hours as needed. Anxiety          . FLUCONAZOLE 150 MG PO TABS   Oral   Take 150 mg by mouth once.         Marland Kitchen FLUOXETINE HCL 20 MG PO CAPS   Oral   Take 20 mg by mouth daily.           Marland Kitchen HYDROCODONE-ACETAMINOPHEN 7.5-325 MG PO TABS   Oral   Take 1 tablet by mouth every 6 (six) hours as needed. Pain          . HYDROCODONE-ACETAMINOPHEN 5-325 MG PO TABS      1 or 2 tabs PO q6 hours prn pain   20 tablet   0   . NAPROXEN 250 MG PO TABS   Oral   Take 1 tablet (250 mg total) by mouth 2 (two) times daily with a meal.   14 tablet  0   . UNKNOWN TO PATIENT   Oral   Take 2 tablets by mouth once as needed. ANTIBIOTIC-NAME UNKNOWN         . ZIPRASIDONE HCL 20 MG PO CAPS   Oral   Take 20 mg by mouth daily.             Triage Vitals: BP 103/66  Pulse 106  Temp 97.3 F (36.3 C)  Resp 20  Ht 5\' 6"  (1.676 m)  Wt 155 lb (70.308 kg)  BMI 25.02 kg/m2  SpO2 100%  LMP 11/12/2011  Physical Exam  Nursing note and vitals reviewed. Constitutional: She is oriented to person, place, and time. She appears well-developed and well-nourished. No distress.  HENT:  Head: Normocephalic and atraumatic.  Mouth/Throat: Oropharynx is clear and moist.  Eyes: Conjunctivae normal and EOM are normal. Pupils are equal, round, and reactive to light.  Neck: Neck supple. No tracheal deviation present.  Cardiovascular: Normal rate and regular rhythm.   Pulmonary/Chest: Effort normal and breath sounds normal. No respiratory distress.  Abdominal: Soft. There is tenderness (tenderness to palpation in  the suprapubic region and right flank). There is no rebound and no guarding.  Musculoskeletal: Normal range of motion. She exhibits no edema.  Neurological: She is alert and oriented to person, place, and time.  Skin: Skin is warm and dry.  Psychiatric: She has a normal mood and affect. Her behavior is normal.    ED Course  Procedures (including critical care time)  DIAGNOSTIC STUDIES: Oxygen Saturation is 100% on room air, normal by my interpretation.    COORDINATION OF CARE: 4:20 PM- Discussed treatment plan which includes UA with pt at bedside and pt agreed to plan.   Labs Reviewed  URINALYSIS, ROUTINE W REFLEX MICROSCOPIC - Abnormal; Notable for the following:    Color, Urine RED (*)  BIOCHEMICALS MAY BE AFFECTED BY COLOR   APPearance CLOUDY (*)     Glucose, UA 250 (*)     Hgb urine dipstick MODERATE (*)     Bilirubin Urine SMALL (*)     Ketones, ur TRACE (*)     Protein, ur 100 (*)     Urobilinogen, UA >8.0 (*)     Nitrite POSITIVE (*)     Leukocytes, UA SMALL (*)     All other components within normal limits  URINE MICROSCOPIC-ADD ON - Abnormal; Notable for the following:    Squamous Epithelial / LPF MANY (*)     Bacteria, UA MANY (*)     All other components within normal limits  HCG, QUANTITATIVE, PREGNANCY  URINE CULTURE   No results found.   No diagnosis found.    MDM  UA consistent with UTI/Pyelonephritis.  She is afebrile and appears clinically well.  Will discharge with pain meds, antibiotics.  Return prn.      I personally performed the services described in this documentation, which was scribed in my presence. The recorded information has been reviewed and is accurate.      Geoffery Lyons, MD 02/12/12 [phone]

## 2012-02-11 NOTE — ED Notes (Signed)
Pt states severe pain to right flank and lots of pressure to lower abdomen when urinating. Symptoms x 2 days.

## 2012-02-21 LAB — URINE CULTURE

## 2012-03-29 ENCOUNTER — Other Ambulatory Visit (HOSPITAL_COMMUNITY): Payer: Self-pay | Admitting: Emergency Medicine

## 2012-03-29 ENCOUNTER — Emergency Department (HOSPITAL_COMMUNITY)
Admission: EM | Admit: 2012-03-29 | Discharge: 2012-03-29 | Disposition: A | Discharge status: Home / Self Care | Payer: Medicaid Other | Attending: Emergency Medicine | Admitting: Emergency Medicine

## 2012-03-29 ENCOUNTER — Ambulatory Visit (HOSPITAL_COMMUNITY)
Admission: RE | Admit: 2012-03-29 | Discharge: 2012-03-29 | Disposition: A | Discharge status: Home / Self Care | Payer: Medicaid Other | Source: Ambulatory Visit | Attending: Emergency Medicine | Admitting: Emergency Medicine

## 2012-03-29 ENCOUNTER — Encounter (HOSPITAL_COMMUNITY): Payer: Self-pay

## 2012-03-29 ENCOUNTER — Inpatient Hospital Stay (HOSPITAL_COMMUNITY): Admit: 2012-03-29 | Payer: Medicaid Other

## 2012-03-29 DIAGNOSIS — R52 Pain, unspecified: Secondary | ICD-10-CM

## 2012-03-29 DIAGNOSIS — O99330 Smoking (tobacco) complicating pregnancy, unspecified trimester: Secondary | ICD-10-CM | POA: Insufficient documentation

## 2012-03-29 DIAGNOSIS — Z3490 Encounter for supervision of normal pregnancy, unspecified, unspecified trimester: Secondary | ICD-10-CM

## 2012-03-29 DIAGNOSIS — N949 Unspecified condition associated with female genital organs and menstrual cycle: Secondary | ICD-10-CM | POA: Insufficient documentation

## 2012-03-29 DIAGNOSIS — O26899 Other specified pregnancy related conditions, unspecified trimester: Secondary | ICD-10-CM

## 2012-03-29 DIAGNOSIS — R109 Unspecified abdominal pain: Secondary | ICD-10-CM | POA: Insufficient documentation

## 2012-03-29 DIAGNOSIS — R102 Pelvic and perineal pain: Secondary | ICD-10-CM

## 2012-03-29 DIAGNOSIS — Z79899 Other long term (current) drug therapy: Secondary | ICD-10-CM | POA: Insufficient documentation

## 2012-03-29 DIAGNOSIS — Z8742 Personal history of other diseases of the female genital tract: Secondary | ICD-10-CM | POA: Insufficient documentation

## 2012-03-29 DIAGNOSIS — F319 Bipolar disorder, unspecified: Secondary | ICD-10-CM | POA: Insufficient documentation

## 2012-03-29 DIAGNOSIS — O9989 Other specified diseases and conditions complicating pregnancy, childbirth and the puerperium: Secondary | ICD-10-CM | POA: Insufficient documentation

## 2012-03-29 DIAGNOSIS — Z862 Personal history of diseases of the blood and blood-forming organs and certain disorders involving the immune mechanism: Secondary | ICD-10-CM | POA: Insufficient documentation

## 2012-03-29 DIAGNOSIS — O99891 Other specified diseases and conditions complicating pregnancy: Secondary | ICD-10-CM | POA: Insufficient documentation

## 2012-03-29 DIAGNOSIS — Z8639 Personal history of other endocrine, nutritional and metabolic disease: Secondary | ICD-10-CM | POA: Insufficient documentation

## 2012-03-29 LAB — COMPREHENSIVE METABOLIC PANEL
ALT: 8 U/L (ref 0–35)
Alkaline Phosphatase: 60 U/L (ref 39–117)
BUN: 11 mg/dL (ref 6–23)
CO2: 26 mEq/L (ref 19–32)
GFR calc Af Amer: >90 mL/min (ref >90)
GFR calc non Af Amer: >90 mL/min (ref >90)
Glucose, Bld: 84 mg/dL (ref 70–99)
Potassium: 3.6 mEq/L (ref 3.5–5.1)
Sodium: 134 mEq/L — ABNORMAL LOW (ref 135–145)
Total Bilirubin: 0.3 mg/dL (ref 0.3–1.2)

## 2012-03-29 LAB — CBC WITH DIFFERENTIAL/PLATELET
Eosinophils Absolute: 0.5 10*3/uL (ref 0.0–0.7)
Hemoglobin: 12.6 g/dL (ref 12.0–15.0)
Lymphocytes Relative: 34 % (ref 12–46)
Lymphs Abs: 3.2 10*3/uL (ref 0.7–4.0)
MCH: 32.1 pg (ref 26.0–34.0)
MCV: 91.9 fL (ref 78.0–100.0)
Monocytes Relative: 7 % (ref 3–12)
Neutrophils Relative %: 54 % (ref 43–77)
Platelets: 257 10*3/uL (ref 150–400)
RBC: 3.93 MIL/uL (ref 3.87–5.11)
WBC: 9.4 10*3/uL (ref 4.0–10.5)

## 2012-03-29 LAB — URINALYSIS, ROUTINE W REFLEX MICROSCOPIC
Leukocytes, UA: NEGATIVE
Nitrite: NEGATIVE
Protein, ur: NEGATIVE mg/dL
Urobilinogen, UA: 0.2 mg/dL (ref 0.0–1.0)

## 2012-03-29 MED ORDER — SODIUM CHLORIDE 0.9 % IV BOLUS (SEPSIS)
1000.0000 mL | Freq: Once | INTRAVENOUS | Status: AC
  Administered 2012-03-29: 1000 mL via INTRAVENOUS

## 2012-03-29 NOTE — ED Notes (Signed)
Lower abd cramping for several days, worse tonight, nausea and vomiting today, no diarrhea.  Urinary frequency.  Pt took a home preg test tonight that was positive, does not know when last period was.

## 2012-03-29 NOTE — ED Provider Notes (Signed)
History     CSN: [id number]  Arrival date & time 03/29/12  0036   First MD Initiated Contact with Patient 03/29/12 0103      Chief Complaint  Patient presents with  . Abdominal Cramping    (Consider location/radiation/quality/duration/timing/severity/associated sxs/prior treatment) Patient is a 32 y.o. female presenting with cramps. The history is provided by the patient.  Abdominal Cramping The primary symptoms of the illness include abdominal pain. The primary symptoms of the illness do not include fever, nausea, vomiting, diarrhea, dysuria, vaginal discharge or vaginal bleeding. Episode onset: 3 days ago. The onset of the illness was gradual. The problem has been gradually worsening.  Associated with: nothing, had a positive pregnancy test at home. Pregnant now: possibly. The patient has not had a change in bowel habit. Symptoms associated with the illness do not include chills, constipation, urgency, hematuria, frequency or back pain.    Past Medical History  Diagnosis Date  . Ovarian cyst   . Ovarian cyst rupture   . Carpal tunnel syndrome   . Bipolar 1 disorder     History reviewed. No pertinent past surgical history.  No family history on file.  History  Substance Use Topics  . Smoking status: Current Every Day Smoker -- 0.5 packs/day    Types: Cigarettes  . Smokeless tobacco: Not on file  . Alcohol Use: No    OB History    Grav Para Term Preterm Abortions TAB SAB Ect Mult Living                  Review of Systems  Constitutional: Negative for fever and chills.  Gastrointestinal: Positive for abdominal pain. Negative for nausea, vomiting, diarrhea and constipation.  Genitourinary: Negative for dysuria, urgency, frequency, hematuria, vaginal bleeding and vaginal discharge.  Musculoskeletal: Negative for back pain.  All other systems reviewed and are negative.    Allergies  Review of patient's allergies indicates no known allergies.  Home Medications    Current Outpatient Rx  Name  Route  Sig  Dispense  Refill  . ALBUTEROL SULFATE HFA 108 (90 BASE) MCG/ACT IN AERS   Inhalation   Inhale 2 puffs into the lungs every 6 (six) hours as needed. For shortness of breath         . AMPHETAMINE-DEXTROAMPHETAMINE 20 MG PO TABS   Oral   Take 20 mg by mouth 2 (two) times daily.           Marland Kitchen DIAZEPAM 10 MG PO TABS   Oral   Take 10 mg by mouth every 6 (six) hours as needed. Anxiety          . FLUOXETINE HCL 20 MG PO CAPS   Oral   Take 20 mg by mouth daily.           Marland Kitchen HYDROCODONE-ACETAMINOPHEN 7.5-325 MG PO TABS   Oral   Take 1 tablet by mouth every 6 (six) hours as needed. Pain          . NAPROXEN 250 MG PO TABS   Oral   Take 1 tablet (250 mg total) by mouth 2 (two) times daily with a meal.   14 tablet   0   . OXYCODONE-ACETAMINOPHEN 5-325 MG PO TABS   Oral   Take 1-2 tablets by mouth every 6 (six) hours as needed for pain.   20 tablet   0   . ZIPRASIDONE HCL 20 MG PO CAPS   Oral   Take 20 mg by mouth daily.           Marland Kitchen  CIPROFLOXACIN HCL 500 MG PO TABS   Oral   Take 1 tablet (500 mg total) by mouth every 12 (twelve) hours.   10 tablet   0     BP 104/65  Pulse 88  Temp 97.6 F (36.4 C) (Oral)  Resp 20  Ht 5\' 4"  (1.626 m)  Wt 140 lb (63.504 kg)  BMI 24.03 kg/m2  SpO2 100%  Physical Exam  Nursing note and vitals reviewed. Constitutional: She is oriented to person, place, and time. She appears well-developed and well-nourished. No distress.  HENT:  Head: Normocephalic and atraumatic.  Neck: Normal range of motion. Neck supple.  Cardiovascular: Normal rate and regular rhythm.  Exam reveals no gallop and no friction rub.   No murmur heard. Pulmonary/Chest: Effort normal and breath sounds normal. No respiratory distress. She has no wheezes.  Abdominal: Soft. Bowel sounds are normal. She exhibits no distension.       There is mild ttp over the suprapubic region.  There is no rebound or guarding.     Musculoskeletal: Normal range of motion.  Neurological: She is alert and oriented to person, place, and time.  Skin: Skin is warm and dry. She is not diaphoretic.    ED Course  Procedures (including critical care time)   Labs Reviewed  CBC WITH DIFFERENTIAL  COMPREHENSIVE METABOLIC PANEL  URINALYSIS, ROUTINE W REFLEX MICROSCOPIC  HCG, QUANTITATIVE, PREGNANCY   No results found.   No diagnosis found.    MDM  The patient presents here with lower abd cramping.  The hcg is positive and greater than 10k.  She appears quite stable hemodynamically and is not bleeding or spotting in the ED.  I feel as though she can safely return in the AM for ultrasound which will be arranged for her.  She is to return in the meantime should her symptoms worsen.  Pelvic exam performed about 2 months ago and was unremarkable.  Patient would prefer not to have another at this time.        Geoffery Lyons, MD 03/29/12 [phone]

## 2012-03-29 NOTE — ED Provider Notes (Signed)
PT returns for outpatient [place]. Results show below. Pt given results and due date. States she has a place to go for prenatal care.    US Ob Comp Less 14 Wks  US Ob Transvaginal  03/29/2012  *RADIOLOGY REPORT*  Clinical Data: Positive pregnancy test, unsure dates, pelvic pain and cramping  OBSTETRIC <14 WK [place] AND TRANSVAGINAL OB US  Technique:  Both transabdominal and transvaginal ultrasound examinations were performed for complete evaluation of the gestation as well as the maternal uterus, adnexal regions, and pelvic cul-de-sac.  Transvaginal technique was performed to assess early pregnancy.  Comparison:  None.  Intrauterine gestational sac:  Visualized/normal in shape. Yolk sac: Visualized Embryo: Visualized Cardiac Activity: Visualized Heart Rate: 144 bpm  CRL: 11  mm  7 w  1 d         [place] EDC: 11/14/12  Maternal uterus/adnexae: The ovaries are normal.  No free fluid.  IMPRESSION: Intrauterine gestational sac, yolk sac, and fetal pole corresponding to 7 weeks 1 day gestational age by today's ultrasound.  No acute abnormality.   Original Report Authenticated By:   Christiana Pellant, M.D.      Devoria Albe, MD, Armando Gang   Ward Givens, MD 03/29/12 1247

## 2012-05-03 ENCOUNTER — Other Ambulatory Visit (HOSPITAL_COMMUNITY)
Admission: RE | Admit: 2012-05-03 | Discharge: 2012-05-03 | Disposition: A | Discharge status: Home / Self Care | Payer: Medicaid Other | Source: Ambulatory Visit | Attending: Obstetrics and Gynecology | Admitting: Obstetrics and Gynecology

## 2012-05-03 ENCOUNTER — Other Ambulatory Visit: Payer: Self-pay | Admitting: Adult Health

## 2012-05-03 DIAGNOSIS — Z113 Encounter for screening for infections with a predominantly sexual mode of transmission: Secondary | ICD-10-CM | POA: Insufficient documentation

## 2012-05-03 DIAGNOSIS — Z01419 Encounter for gynecological examination (general) (routine) without abnormal findings: Secondary | ICD-10-CM | POA: Insufficient documentation

## 2012-05-03 DIAGNOSIS — Z1151 Encounter for screening for human papillomavirus (HPV): Secondary | ICD-10-CM | POA: Insufficient documentation

## 2012-05-03 LAB — OB RESULTS CONSOLE VARICELLA ZOSTER ANTIBODY, IGG: Varicella: IMMUNE

## 2012-05-03 LAB — OB RESULTS CONSOLE HEPATITIS B SURFACE ANTIGEN: Hepatitis B Surface Ag: NEGATIVE

## 2012-05-03 LAB — OB RESULTS CONSOLE RUBELLA ANTIBODY, IGM: Rubella: IMMUNE

## 2012-05-03 LAB — OB RESULTS CONSOLE ABO/RH: RH Type: POSITIVE

## 2012-05-11 LAB — CYSTIC FIBROSIS DIAGNOSTIC STUDY

## 2012-05-15 ENCOUNTER — Encounter: Payer: Self-pay | Admitting: *Deleted

## 2012-05-15 DIAGNOSIS — F329 Major depressive disorder, single episode, unspecified: Secondary | ICD-10-CM | POA: Insufficient documentation

## 2012-05-15 DIAGNOSIS — G5600 Carpal tunnel syndrome, unspecified upper limb: Secondary | ICD-10-CM | POA: Insufficient documentation

## 2012-05-15 DIAGNOSIS — F319 Bipolar disorder, unspecified: Secondary | ICD-10-CM

## 2012-05-15 DIAGNOSIS — F32A Depression, unspecified: Secondary | ICD-10-CM

## 2012-05-15 DIAGNOSIS — G8929 Other chronic pain: Secondary | ICD-10-CM | POA: Insufficient documentation

## 2012-05-29 ENCOUNTER — Encounter (HOSPITAL_COMMUNITY): Payer: Self-pay | Admitting: *Deleted

## 2012-05-29 ENCOUNTER — Emergency Department (HOSPITAL_COMMUNITY)
Admission: EM | Admit: 2012-05-29 | Discharge: 2012-05-29 | Discharge status: Against Medical Advice | Payer: Medicaid Other | Attending: Emergency Medicine | Admitting: Emergency Medicine

## 2012-05-29 DIAGNOSIS — R42 Dizziness and giddiness: Secondary | ICD-10-CM | POA: Insufficient documentation

## 2012-05-29 DIAGNOSIS — R109 Unspecified abdominal pain: Secondary | ICD-10-CM | POA: Insufficient documentation

## 2012-05-29 DIAGNOSIS — F17200 Nicotine dependence, unspecified, uncomplicated: Secondary | ICD-10-CM | POA: Insufficient documentation

## 2012-05-29 NOTE — ED Notes (Signed)
Pt c/o of low abd pain and dizziness that started 3 days ago. Had miscarriage 2 wks ago has had spotting since.

## 2012-05-29 NOTE — ED Notes (Signed)
Called pt to room. No answer. 1st attempt

## 2012-05-29 NOTE — ED Notes (Signed)
Called to go to room from waiting room , 2nd attempt,. No answer.

## 2012-05-29 NOTE — ED Notes (Signed)
Called for 3rd attempt to move to room from waiting room.  No answer.

## 2012-06-23 ENCOUNTER — Emergency Department (HOSPITAL_COMMUNITY)
Admission: EM | Admit: 2012-06-23 | Discharge: 2012-06-24 | Disposition: A | Discharge status: Home / Self Care | Payer: Medicaid Other | Attending: Emergency Medicine | Admitting: Emergency Medicine

## 2012-06-23 ENCOUNTER — Encounter (HOSPITAL_COMMUNITY): Payer: Self-pay | Admitting: *Deleted

## 2012-06-23 DIAGNOSIS — N760 Acute vaginitis: Secondary | ICD-10-CM | POA: Insufficient documentation

## 2012-06-23 DIAGNOSIS — F17200 Nicotine dependence, unspecified, uncomplicated: Secondary | ICD-10-CM | POA: Insufficient documentation

## 2012-06-23 DIAGNOSIS — Z8669 Personal history of other diseases of the nervous system and sense organs: Secondary | ICD-10-CM | POA: Insufficient documentation

## 2012-06-23 DIAGNOSIS — Z3202 Encounter for pregnancy test, result negative: Secondary | ICD-10-CM | POA: Insufficient documentation

## 2012-06-23 DIAGNOSIS — F319 Bipolar disorder, unspecified: Secondary | ICD-10-CM | POA: Insufficient documentation

## 2012-06-23 DIAGNOSIS — Z8742 Personal history of other diseases of the female genital tract: Secondary | ICD-10-CM | POA: Insufficient documentation

## 2012-06-23 DIAGNOSIS — Z79899 Other long term (current) drug therapy: Secondary | ICD-10-CM | POA: Insufficient documentation

## 2012-06-23 DIAGNOSIS — B9689 Other specified bacterial agents as the cause of diseases classified elsewhere: Secondary | ICD-10-CM

## 2012-06-23 DIAGNOSIS — N83209 Unspecified ovarian cyst, unspecified side: Secondary | ICD-10-CM

## 2012-06-23 LAB — URINALYSIS, ROUTINE W REFLEX MICROSCOPIC
Glucose, UA: NEGATIVE mg/dL
Hgb urine dipstick: NEGATIVE
Leukocytes, UA: NEGATIVE
Protein, ur: NEGATIVE mg/dL
Urobilinogen, UA: 0.2 mg/dL (ref 0.0–1.0)

## 2012-06-23 LAB — PREGNANCY, URINE: Preg Test, Ur: NEGATIVE

## 2012-06-23 NOTE — ED Notes (Signed)
Pt states she had a miscarriage in March. Now she is having lower abdominal pain, pelvic pain, and lower back pain.

## 2012-06-24 ENCOUNTER — Emergency Department (HOSPITAL_COMMUNITY): Payer: Medicaid Other

## 2012-06-24 LAB — WET PREP, GENITAL

## 2012-06-24 MED ORDER — METRONIDAZOLE 500 MG PO TABS: 500.0000 mg | ORAL_TABLET | Freq: Two times a day (BID) | ORAL | Status: DC

## 2012-06-24 MED ORDER — KETOROLAC TROMETHAMINE 60 MG/2ML IM SOLN
60.0000 mg | Freq: Once | INTRAMUSCULAR | Status: AC
  Administered 2012-06-24: 60 mg via INTRAMUSCULAR
  Filled 2012-06-24: qty 2

## 2012-06-24 MED ORDER — METRONIDAZOLE 500 MG PO TABS
500.0000 mg | ORAL_TABLET | Freq: Once | ORAL | Status: AC
  Administered 2012-06-24: 500 mg via ORAL
  Filled 2012-06-24: qty 1

## 2012-06-24 NOTE — ED Provider Notes (Signed)
History     CSN: [id number]  Arrival date & time 06/23/12  2251   First MD Initiated Contact with Patient 06/24/12 0003      Chief Complaint  Patient presents with  . Abdominal Pain    (Consider location/radiation/quality/duration/timing/severity/associated sxs/prior treatment) HPI Martha Cochran is a 32 y.o. female who presents to the Emergency Department complaining of lower right abdominal pain. She had a miscarriage in March and now has RLQ pain. Pain is sharp and radiates to the suprapubic area. Denies vaginal discharge. Denies fever, chills, nausea, vomiting.  Past Medical History  Diagnosis Date  . Ovarian cyst   . Ovarian cyst rupture   . Carpal tunnel syndrome   . Bipolar 1 disorder   . Depression   . Bipolar disorder     History reviewed. No pertinent past surgical history.  Family History  Problem Relation Age of Onset  . Diabetes Mother     History  Substance Use Topics  . Smoking status: Current Every Day Smoker -- 0.50 packs/day    Types: Cigarettes  . Smokeless tobacco: Not on file  . Alcohol Use: No    OB History   Grav Para Term Preterm Abortions TAB SAB Ect Mult Living   4 3 2       3       Review of Systems  Constitutional: Negative for fever.       10 Systems reviewed and are negative for acute change except as noted in the HPI.  HENT: Negative for congestion.   Eyes: Negative for discharge and redness.  Respiratory: Negative for cough and shortness of breath.   Cardiovascular: Negative for chest pain.  Gastrointestinal: Positive for abdominal pain. Negative for vomiting.  Musculoskeletal: Negative for back pain.  Skin: Negative for rash.  Neurological: Negative for syncope, numbness and headaches.  Psychiatric/Behavioral:       No behavior change.    Allergies  Review of patient's allergies indicates no known allergies.  Home Medications   Current Outpatient Rx  Name  Route  Sig  Dispense  Refill  . albuterol (PROVENTIL  HFA;VENTOLIN HFA) 108 (90 BASE) MCG/ACT inhaler   Inhalation   Inhale 2 puffs into the lungs every 6 (six) hours as needed. For shortness of breath         . amphetamine-dextroamphetamine (ADDERALL) 20 MG tablet   Oral   Take 20 mg by mouth 2 (two) times daily.           . diazepam (VALIUM) 10 MG tablet   Oral   Take 10 mg by mouth every 6 (six) hours as needed. Anxiety          . FLUoxetine (PROZAC) 20 MG capsule   Oral   Take 20 mg by mouth daily.           Marland Kitchen oxyCODONE-acetaminophen (PERCOCET/ROXICET) 5-325 MG per tablet   Oral   Take 1-2 tablets by mouth every 6 (six) hours as needed for pain.   20 tablet   0   . ziprasidone (GEODON) 20 MG capsule   Oral   Take 20 mg by mouth daily.             BP 131/91  Pulse 102  Temp(Src) 97.6 F (36.4 C) (Oral)  Resp 20  Ht 5\' 6"  (1.676 m)  Wt 140 lb (63.504 kg)  BMI 22.61 kg/m2  SpO2 100%  LMP 05/12/2012  Physical Exam  Nursing note and vitals reviewed. Constitutional: She appears well-developed  and well-nourished.  Awake, alert, nontoxic appearance.  HENT:  Head: Normocephalic and atraumatic.  Right Ear: External ear normal.  Left Ear: External ear normal.  Eyes: EOM are normal. Pupils are equal, round, and reactive to light.  Neck: Normal range of motion. Neck supple.  Cardiovascular: Normal rate and intact distal pulses.   Pulmonary/Chest: Effort normal and breath sounds normal. She exhibits no tenderness.  Abdominal: Soft. Bowel sounds are normal. There is tenderness. There is no rebound.  Mild RLQ and suprapubic tenderness  Genitourinary:  Normal cervix, closed. Vaginal discharge. Bimanual exam with mild tenderness to right adnexa.   Musculoskeletal: She exhibits no tenderness.  Baseline ROM, no obvious new focal weakness.  Neurological:  Mental status and motor strength appears baseline for patient and situation.  Skin: No rash noted.  Psychiatric: She has a normal mood and affect.    ED Course   Procedures (including critical care time) Results for orders placed during the hospital encounter of 06/23/12  WET PREP, GENITAL      Result Value Range   Yeast Wet Prep HPF POC NONE SEEN  NONE SEEN   Trich, Wet Prep NONE SEEN  NONE SEEN   Clue Cells Wet Prep HPF POC FEW (*) NONE SEEN   WBC, Wet Prep HPF POC FEW (*) NONE SEEN  URINALYSIS, ROUTINE W REFLEX MICROSCOPIC      Result Value Range   Color, Urine YELLOW  YELLOW   APPearance CLEAR  CLEAR   Specific Gravity, Urine >1.030 (*) 1.005 - 1.030   pH 6.0  5.0 - 8.0   Glucose, UA NEGATIVE  NEGATIVE mg/dL   Hgb urine dipstick NEGATIVE  NEGATIVE   Bilirubin Urine NEGATIVE  NEGATIVE   Ketones, ur TRACE (*) NEGATIVE mg/dL   Protein, ur NEGATIVE  NEGATIVE mg/dL   Urobilinogen, UA 0.2  0.0 - 1.0 mg/dL   Nitrite NEGATIVE  NEGATIVE   Leukocytes, UA NEGATIVE  NEGATIVE  PREGNANCY, URINE      Result Value Range   Preg Test, Ur NEGATIVE  NEGATIVE    Dg Abd Acute W/chest  06/24/2012  *RADIOLOGY REPORT*  Clinical Data: Abdominal pain  ACUTE ABDOMEN SERIES (ABDOMEN 2 VIEW & CHEST 1 VIEW)  Comparison: 06/22/2012 CT from [facility]  Findings: Calcified right hilar lymph node.  Lungs clear. Remote left clavicle trauma suggested.  No free intraperitoneal air. Moderate stool burden.  The bowel gas pattern is non-obstructive. Organ outlines are normal where seen. No acute or aggressive osseous abnormality identified.  IMPRESSION: Nonobstructive bowel gas pattern.  Moderate stool burden.   Original Report Authenticated By: Jearld Lesch, M.D.     Medications  metroNIDAZOLE (FLAGYL) tablet 500 mg (not administered)  ketorolac (TORADOL) injection 60 mg (60 mg Intramuscular Given 06/24/12 0215)      MDM  Patient with RLQ pain and suprapubic pain. Exam with mild discomfort. Labs with UA normal except a few clue cells. Reviewed results with patient. Pt stable in ED with no significant deterioration in condition.The patient appears reasonably  screened and/or stabilized for discharge and I doubt any other medical condition or other [facility] requiring further screening, evaluation, or treatment in the ED at this time prior to discharge.  MDM Reviewed: nursing note and vitals Interpretation: labs           Nicoletta Dress. Colon Branch, MD 06/24/12 5621

## 2012-08-09 ENCOUNTER — Encounter (HOSPITAL_COMMUNITY): Payer: Self-pay | Admitting: *Deleted

## 2012-08-09 ENCOUNTER — Emergency Department (HOSPITAL_COMMUNITY)
Admission: EM | Admit: 2012-08-09 | Discharge: 2012-08-09 | Disposition: A | Discharge status: Home / Self Care | Payer: Medicaid Other | Attending: Emergency Medicine | Admitting: Emergency Medicine

## 2012-08-09 DIAGNOSIS — K0889 Other specified disorders of teeth and supporting structures: Secondary | ICD-10-CM

## 2012-08-09 DIAGNOSIS — F319 Bipolar disorder, unspecified: Secondary | ICD-10-CM | POA: Insufficient documentation

## 2012-08-09 DIAGNOSIS — R112 Nausea with vomiting, unspecified: Secondary | ICD-10-CM | POA: Insufficient documentation

## 2012-08-09 DIAGNOSIS — Z8742 Personal history of other diseases of the female genital tract: Secondary | ICD-10-CM | POA: Insufficient documentation

## 2012-08-09 DIAGNOSIS — K089 Disorder of teeth and supporting structures, unspecified: Secondary | ICD-10-CM | POA: Insufficient documentation

## 2012-08-09 DIAGNOSIS — Z79899 Other long term (current) drug therapy: Secondary | ICD-10-CM | POA: Insufficient documentation

## 2012-08-09 DIAGNOSIS — Z8669 Personal history of other diseases of the nervous system and sense organs: Secondary | ICD-10-CM | POA: Insufficient documentation

## 2012-08-09 DIAGNOSIS — F17200 Nicotine dependence, unspecified, uncomplicated: Secondary | ICD-10-CM | POA: Insufficient documentation

## 2012-08-09 DIAGNOSIS — K08109 Complete loss of teeth, unspecified cause, unspecified class: Secondary | ICD-10-CM | POA: Insufficient documentation

## 2012-08-09 MED ORDER — PENICILLIN V POTASSIUM 500 MG PO TABS: 500.0000 mg | ORAL_TABLET | Freq: Four times a day (QID) | ORAL | Status: AC

## 2012-08-09 MED ORDER — TRAMADOL HCL 50 MG PO TABS: 50.0000 mg | ORAL_TABLET | Freq: Four times a day (QID) | ORAL | Status: DC | PRN

## 2012-08-09 NOTE — ED Notes (Addendum)
Patient with continued pain at this time. Encouraged to fill Rx immediately and begin taking. Respirations even and unlabored. Skin warm/dry. Discharge instructions reviewed with patient at this time. Patient given opportunity to voice concerns/ask questions.  Patient discharged at this time and left Emergency Department with steady gait.

## 2012-08-09 NOTE — ED Provider Notes (Signed)
History    This chart was scribed for Glynn Octave, MD by Quintella Reichert, ED scribe.  This patient was seen in room APFT24/APFT24 and the patient's care was started at 4:40 PM.   CSN: [id number]  Arrival date & time 08/09/12  1618     Chief Complaint  Patient presents with  . Dental Pain     The history is provided by the patient. No language interpreter was used.    HPI Comments: Martha Cochran is a 32 y.o. female who presents to the Emergency Department complaining of constant, progressively-worsening, moderate-to-severe left-sided dental pain that began when she chipped her left lower molar on 07/21/12 and became much more severe today, Pt also reports nausea and one episode of emesis today associated with pain, as well as some difficulty swallowing secondary to pain.  She denies SOB, pain to any other area, fever, sore throat, abdominal pain, dysuria, rash, weakness, numbness, or any other associated symptoms.  PT denies DM or any other chronic medical conditions and does not take medications regularly.  She has not made an appointment with her dentist.   Past Medical History  Diagnosis Date  . Ovarian cyst   . Ovarian cyst rupture   . Carpal tunnel syndrome   . Bipolar 1 disorder   . Depression   . Bipolar disorder     History reviewed. No pertinent past surgical history.  Family History  Problem Relation Age of Onset  . Diabetes Mother     History  Substance Use Topics  . Smoking status: Current Every Day Smoker -- 0.50 packs/day    Types: Cigarettes  . Smokeless tobacco: Not on file  . Alcohol Use: No    OB History   Grav Para Term Preterm Abortions TAB SAB Ect Mult Living   4 3 2       3       Review of Systems A complete 10 system review of systems was obtained and all systems are negative except as noted in the HPI and PMH.    Allergies  Review of patient's allergies indicates no known allergies.  Home Medications   Current Outpatient Rx   Name  Route  Sig  Dispense  Refill  . albuterol (PROVENTIL HFA;VENTOLIN HFA) 108 (90 BASE) MCG/ACT inhaler   Inhalation   Inhale 2 puffs into the lungs every 6 (six) hours as needed. For shortness of breath         . amphetamine-dextroamphetamine (ADDERALL) 20 MG tablet   Oral   Take 20 mg by mouth 2 (two) times daily.           . diazepam (VALIUM) 10 MG tablet   Oral   Take 10 mg by mouth every 6 (six) hours as needed. Anxiety          . FLUoxetine (PROZAC) 20 MG capsule   Oral   Take 20 mg by mouth daily.           . metroNIDAZOLE (FLAGYL) 500 MG tablet   Oral   Take 1 tablet (500 mg total) by mouth 2 (two) times daily.   14 tablet   0   . oxyCODONE-acetaminophen (PERCOCET/ROXICET) 5-325 MG per tablet   Oral   Take 1-2 tablets by mouth every 6 (six) hours as needed for pain.   20 tablet   0   . ziprasidone (GEODON) 20 MG capsule   Oral   Take 20 mg by mouth daily.  BP 112/86  Pulse 89  Temp(Src) 97.5 F (36.4 C) (Oral)  Resp 18  Ht 5\' 4"  (1.626 m)  Wt 160 lb (72.576 kg)  BMI 27.45 kg/m2  SpO2 100%  LMP 08/07/2012  Physical Exam  Nursing note and vitals reviewed. Constitutional: She is oriented to person, place, and time. She appears well-developed and well-nourished. No distress.  HENT:  Head: Normocephalic and atraumatic.  Left lower molar tender, no abscess. Floor of mouth soft. Multiple missing teeth.  Eyes: EOM are normal.  Neck: Neck supple. No tracheal deviation present.  Cardiovascular: Normal rate.   Pulmonary/Chest: Effort normal. No respiratory distress.  Musculoskeletal: Normal range of motion.  Neurological: She is alert and oriented to person, place, and time.  Skin: Skin is warm and dry.  Psychiatric: She has a normal mood and affect. Her behavior is normal.    ED Course  Procedures (including critical care time)  DIAGNOSTIC STUDIES: Oxygen Saturation is 100% on room air, normal by my interpretation.     COORDINATION OF CARE: 4:43 PM-Discussed treatment plan which includes pain medication and f/u with a dentist with pt at bedside and pt agreed to plan.      Labs Reviewed - No data to display No results found.   No diagnosis found.    MDM  Left lower dental pain for the past 2 months worse today. No difficulty breathing or swallowing. No fevers or vomiting.  No evidence of Ludwig's angina on exam. No abscess.  Narcotic database reviewed. Patient had 40 Vicodin prescribed on May 29. She's also had multiple other prescriptions for narcotics. Patient states she has a dentist that she will see. Will treat pain with Ultram.   I personally performed the services described in this documentation, which was scribed in my presence. The recorded information has been reviewed and is accurate.    Glynn Octave, MD 08/09/12 [phone]

## 2012-08-09 NOTE — ED Notes (Signed)
Dental pain since April, worse today

## 2012-08-14 DIAGNOSIS — R079 Chest pain, unspecified: Secondary | ICD-10-CM

## 2013-06-26 ENCOUNTER — Emergency Department (HOSPITAL_COMMUNITY)
Admission: EM | Admit: 2013-06-26 | Discharge: 2013-06-26 | Disposition: A | Discharge status: Home / Self Care | Payer: Medicaid Other | Attending: Emergency Medicine | Admitting: Emergency Medicine

## 2013-06-26 ENCOUNTER — Encounter (HOSPITAL_COMMUNITY): Payer: Self-pay | Admitting: Emergency Medicine

## 2013-06-26 DIAGNOSIS — F319 Bipolar disorder, unspecified: Secondary | ICD-10-CM | POA: Insufficient documentation

## 2013-06-26 DIAGNOSIS — R51 Headache: Secondary | ICD-10-CM | POA: Insufficient documentation

## 2013-06-26 DIAGNOSIS — Z8669 Personal history of other diseases of the nervous system and sense organs: Secondary | ICD-10-CM | POA: Insufficient documentation

## 2013-06-26 DIAGNOSIS — K0889 Other specified disorders of teeth and supporting structures: Secondary | ICD-10-CM

## 2013-06-26 DIAGNOSIS — Z8742 Personal history of other diseases of the female genital tract: Secondary | ICD-10-CM | POA: Insufficient documentation

## 2013-06-26 DIAGNOSIS — F17200 Nicotine dependence, unspecified, uncomplicated: Secondary | ICD-10-CM | POA: Insufficient documentation

## 2013-06-26 DIAGNOSIS — Z79899 Other long term (current) drug therapy: Secondary | ICD-10-CM | POA: Insufficient documentation

## 2013-06-26 DIAGNOSIS — K089 Disorder of teeth and supporting structures, unspecified: Secondary | ICD-10-CM | POA: Insufficient documentation

## 2013-06-26 MED ORDER — KETOROLAC TROMETHAMINE 10 MG PO TABS
10.0000 mg | ORAL_TABLET | Freq: Once | ORAL | Status: AC
  Administered 2013-06-26: 10 mg via ORAL
  Filled 2013-06-26: qty 1

## 2013-06-26 MED ORDER — IBUPROFEN 800 MG PO TABS: 800.0000 mg | ORAL_TABLET | Freq: Three times a day (TID) | ORAL | Status: DC

## 2013-06-26 MED ORDER — TRAMADOL HCL 50 MG PO TABS: 50.0000 mg | ORAL_TABLET | Freq: Three times a day (TID) | ORAL | Status: DC

## 2013-06-26 MED ORDER — ACETAMINOPHEN 500 MG PO TABS
1000.0000 mg | ORAL_TABLET | Freq: Once | ORAL | Status: AC
  Administered 2013-06-26: 1000 mg via ORAL
  Filled 2013-06-26: qty 2

## 2013-06-26 MED ORDER — PENICILLIN V POTASSIUM 250 MG PO TABS
500.0000 mg | ORAL_TABLET | Freq: Once | ORAL | Status: AC
Start: 2013-06-26 — End: 2013-06-26
  Administered 2013-06-26: 500 mg via ORAL
  Filled 2013-06-26: qty 2

## 2013-06-26 MED ORDER — AMOXICILLIN 500 MG PO CAPS: 500.0000 mg | ORAL_CAPSULE | Freq: Three times a day (TID) | ORAL | Status: DC

## 2013-06-26 NOTE — Discharge Instructions (Signed)
Dental Pain °Toothache is pain in or around a tooth. It may get worse with chewing or with cold or heat.  °HOME CARE °· Your dentist may use a numbing medicine during treatment. If so, you may need to avoid eating until the medicine wears off. Ask your dentist about this. °· Only take medicine as told by your dentist or doctor. °· Avoid chewing food near the painful tooth until after all treatment is done. Ask your dentist about this. °GET HELP RIGHT AWAY IF:  °· The problem gets worse or new problems appear. °· You have a fever. °· There is redness and puffiness (swelling) of the face, jaw, or neck. °· You cannot open your mouth. °· There is pain in the jaw. °· There is very bad pain that is not helped by medicine. °MAKE SURE YOU:  °· Understand these instructions. °· Will watch your condition. °· Will get help right away if you are not doing well or get worse. °Document Released: 07/28/2007 Document Revised: 05/03/2011 Document Reviewed: 07/28/2007 °ExitCare® Patient Information ©2014 [employer]. ° °

## 2013-06-26 NOTE — ED Provider Notes (Signed)
CSN: [id number]     Arrival date & time 06/26/13  1415 History   First MD Initiated Contact with Patient 06/26/13 1459     Chief Complaint  Patient presents with  . Dental Pain     (Consider location/radiation/quality/duration/timing/severity/associated sxs/prior Treatment) Patient is a 33 y.o. female presenting with tooth pain. The history is provided by the patient.  Dental Pain Location:  Upper and lower Quality:  Aching and throbbing Severity:  Moderate Onset quality:  Gradual Timing:  Intermittent Progression:  Worsening Chronicity:  New Context: not abscess and not trauma   Relieved by:  Nothing Worsened by:  Cold food/drink Associated symptoms: headaches   Associated symptoms: no difficulty swallowing and no neck pain   Risk factors: smoking   Risk factors: no immunosuppression     Past Medical History  Diagnosis Date  . Ovarian cyst   . Ovarian cyst rupture   . Carpal tunnel syndrome   . Bipolar 1 disorder   . Depression   . Bipolar disorder    History reviewed. No pertinent past surgical history. Family History  Problem Relation Age of Onset  . Diabetes Mother    History  Substance Use Topics  . Smoking status: Current Every Day Smoker -- 0.50 packs/day    Types: Cigarettes  . Smokeless tobacco: Not on file  . Alcohol Use: No   OB History   Grav Para Term Preterm Abortions TAB SAB Ect Mult Living   4 3 2       3      Review of Systems  Constitutional: Negative for activity change.       All ROS Neg except as noted in HPI  HENT: Positive for dental problem. Negative for nosebleeds.   Eyes: Negative for photophobia and discharge.  Respiratory: Negative for cough, shortness of breath and wheezing.   Cardiovascular: Negative for chest pain and palpitations.  Gastrointestinal: Negative for abdominal pain and blood in stool.  Genitourinary: Negative for dysuria, frequency and hematuria.  Musculoskeletal: Negative for arthralgias, back pain and neck  pain.  Skin: Negative.   Neurological: Positive for headaches. Negative for dizziness, seizures and speech difficulty.  Psychiatric/Behavioral: Negative for hallucinations and confusion.      Allergies  Review of patient's allergies indicates no known allergies.  Home Medications   Prior to Admission medications   Medication Sig Start Date End Date Taking? Authorizing Provider  albuterol (PROVENTIL HFA;VENTOLIN HFA) 108 (90 BASE) MCG/ACT inhaler Inhale 2 puffs into the lungs every 6 (six) hours as needed. For shortness of breath    Historical Provider, MD  amphetamine-dextroamphetamine (ADDERALL) 20 MG tablet Take 20 mg by mouth 2 (two) times daily.      Historical Provider, MD  diazepam (VALIUM) 10 MG tablet Take 10 mg by mouth every 6 (six) hours as needed. Anxiety     Historical Provider, MD  FLUoxetine (PROZAC) 20 MG capsule Take 20 mg by mouth daily.      Historical Provider, MD  ziprasidone (GEODON) 20 MG capsule Take 20 mg by mouth daily.      Historical Provider, MD   BP 114/72  Pulse 87  Temp(Src) 98.3 F (36.8 C) (Oral)  Ht 5\' 4"  (1.626 m)  Wt 150 lb (68.04 kg)  BMI 25.73 kg/m2  SpO2 98%  LMP 06/11/2013 Physical Exam  Nursing note and vitals reviewed. Constitutional: She is oriented to person, place, and time. She appears well-developed and well-nourished.  Non-toxic appearance.  HENT:  Head: Normocephalic.  Right Ear:  Tympanic membrane and external ear normal.  Left Ear: Tympanic membrane and external ear normal.  T. oropharynx is clear. The patient has pain to on percussion and palpation of the upper and lower molar areas. There is no swelling of the gum. There is no visible abscess. There is no swelling of the tongue. There is no swelling under the tongue. Speech is clear and understandable.  Eyes: EOM and lids are normal. Pupils are equal, round, and reactive to light.  Neck: Normal range of motion. Neck supple. Carotid bruit is not present.  Cardiovascular:  Normal rate, regular rhythm, normal heart sounds, intact distal pulses and normal pulses.   Pulmonary/Chest: Breath sounds normal. No respiratory distress.  Abdominal: Soft. Bowel sounds are normal. There is no tenderness. There is no guarding.  Musculoskeletal: Normal range of motion.  Lymphadenopathy:       Head (right side): No submandibular adenopathy present.       Head (left side): No submandibular adenopathy present.    She has no cervical adenopathy.  Neurological: She is alert and oriented to person, place, and time. She has normal strength. No cranial nerve deficit or sensory deficit.  Skin: Skin is warm and dry.  Psychiatric: She has a normal mood and affect. Her speech is normal.    ED Course  Procedures (including critical care time) Labs Review Labs Reviewed - No data to display  Imaging Review No results found.   EKG Interpretation None      MDM The patient has multiple fillings, especially of the molar areas. No visible abscess appreciated on today's examination. Vital signs are well within normal limits. No evidence for Ludwig's Angina.prescription for Ultram, ibuprofen, and Amoxil given to the patient. Patient advised to see the dentist as sone as possible.    Final diagnoses:  None    **I have reviewed nursing notes, vital signs, and all appropriate lab and imaging results for this patient.Kathie Dike*    Claudette Wermuth M Jaston Havens, PA-C 06/26/13 [phone]

## 2013-06-26 NOTE — ED Provider Notes (Signed)
Medical screening examination/treatment/procedure(s) were performed by non-physician practitioner and as supervising physician I was immediately available for consultation/collaboration.   EKG Interpretation None      Devoria AlbeIva Markesha Hannig, MD, Armando GangFACEP   Ward GivensIva L Laporsche Hoeger, MD 06/26/13 [phone]

## 2013-06-26 NOTE — ED Notes (Signed)
Patient states that last night woke up with pain to left side of mouth. Upper and lower back teeth have fillings in place but are extremely painful

## 2013-12-24 ENCOUNTER — Encounter (HOSPITAL_COMMUNITY): Payer: Self-pay | Admitting: Emergency Medicine

## 2014-01-21 ENCOUNTER — Encounter (HOSPITAL_COMMUNITY): Payer: Self-pay | Admitting: *Deleted

## 2014-01-21 ENCOUNTER — Emergency Department (HOSPITAL_COMMUNITY): Payer: Medicaid Other

## 2014-01-21 ENCOUNTER — Emergency Department (HOSPITAL_COMMUNITY)
Admission: EM | Admit: 2014-01-21 | Discharge: 2014-01-21 | Disposition: A | Discharge status: Home / Self Care | Payer: Medicaid Other | Attending: Emergency Medicine | Admitting: Emergency Medicine

## 2014-01-21 DIAGNOSIS — Z720 Tobacco use: Secondary | ICD-10-CM | POA: Insufficient documentation

## 2014-01-21 DIAGNOSIS — Z791 Long term (current) use of non-steroidal anti-inflammatories (NSAID): Secondary | ICD-10-CM | POA: Diagnosis not present

## 2014-01-21 DIAGNOSIS — Z79899 Other long term (current) drug therapy: Secondary | ICD-10-CM | POA: Insufficient documentation

## 2014-01-21 DIAGNOSIS — S59912A Unspecified injury of left forearm, initial encounter: Secondary | ICD-10-CM | POA: Diagnosis present

## 2014-01-21 DIAGNOSIS — Y9301 Activity, walking, marching and hiking: Secondary | ICD-10-CM | POA: Insufficient documentation

## 2014-01-21 DIAGNOSIS — Z8669 Personal history of other diseases of the nervous system and sense organs: Secondary | ICD-10-CM | POA: Insufficient documentation

## 2014-01-21 DIAGNOSIS — F319 Bipolar disorder, unspecified: Secondary | ICD-10-CM | POA: Diagnosis not present

## 2014-01-21 DIAGNOSIS — Y9289 Other specified places as the place of occurrence of the external cause: Secondary | ICD-10-CM | POA: Diagnosis not present

## 2014-01-21 DIAGNOSIS — Z8742 Personal history of other diseases of the female genital tract: Secondary | ICD-10-CM | POA: Diagnosis not present

## 2014-01-21 DIAGNOSIS — Y998 Other external cause status: Secondary | ICD-10-CM | POA: Insufficient documentation

## 2014-01-21 DIAGNOSIS — Z792 Long term (current) use of antibiotics: Secondary | ICD-10-CM | POA: Diagnosis not present

## 2014-01-21 DIAGNOSIS — T1490XA Injury, unspecified, initial encounter: Secondary | ICD-10-CM

## 2014-01-21 DIAGNOSIS — W01198A Fall on same level from slipping, tripping and stumbling with subsequent striking against other object, initial encounter: Secondary | ICD-10-CM | POA: Insufficient documentation

## 2014-01-21 DIAGNOSIS — S52612A Displaced fracture of left ulna styloid process, initial encounter for closed fracture: Secondary | ICD-10-CM | POA: Diagnosis not present

## 2014-01-21 DIAGNOSIS — S52202A Unspecified fracture of shaft of left ulna, initial encounter for closed fracture: Secondary | ICD-10-CM

## 2014-01-21 MED ORDER — OXYCODONE-ACETAMINOPHEN 5-325 MG PO TABS
2.0000 | ORAL_TABLET | Freq: Once | ORAL | Status: AC
  Administered 2014-01-21: 2 via ORAL
  Filled 2014-01-21: qty 2

## 2014-01-21 MED ORDER — OXYCODONE-ACETAMINOPHEN 5-325 MG PO TABS: 1.0000 | ORAL_TABLET | ORAL | Status: DC | PRN

## 2014-01-21 NOTE — Discharge Instructions (Signed)
Cast or Splint Care °Casts and splints support injured limbs and keep bones from moving while they heal.  °HOME CARE °· Keep the cast or splint uncovered during the drying period. °¨ A plaster cast can take 24 to 48 hours to dry. °¨ A fiberglass cast will dry in less than 1 hour. °· Do not rest the cast on anything harder than a pillow for 24 hours. °· Do not put weight on your injured limb. Do not put pressure on the cast. Wait for your doctor's approval. °· Keep the cast or splint dry. °¨ Cover the cast or splint with a plastic bag during baths or wet weather. °¨ If you have a cast over your chest and belly (trunk), take sponge baths until the cast is taken off. °¨ If your cast gets wet, dry it with a towel or blow dryer. Use the cool setting on the blow dryer. °· Keep your cast or splint clean. Wash a dirty cast with a damp cloth. °· Do not put any objects under your cast or splint. °· Do not scratch the skin under the cast with an object. If itching is a problem, use a blow dryer on a cool setting over the itchy area. °· Do not trim or cut your cast. °· Do not take out the padding from inside your cast. °· Exercise your joints near the cast as told by your doctor. °· Raise (elevate) your injured limb on 1 or 2 pillows for the first 1 to 3 days. °GET HELP IF: °· Your cast or splint cracks. °· Your cast or splint is too tight or too loose. °· You itch badly under the cast. °· Your cast gets wet or has a soft spot. °· You have a bad smell coming from the cast. °· You get an object stuck under the cast. °· Your skin around the cast becomes red or sore. °· You have new or more pain after the cast is put on. °GET HELP RIGHT AWAY IF: °· You have fluid leaking through the cast. °· You cannot move your fingers or toes. °· Your fingers or toes turn blue or white or are cool, painful, or puffy (swollen). °· You have tingling or lose feeling (numbness) around the injured area. °· You have bad pain or pressure under the  cast. °· You have trouble breathing or have shortness of breath. °· You have chest pain. °Document Released: 06/10/2010 Document Revised: 10/11/2012 Document Reviewed: 08/17/2012 °ExitCare® Patient Information ©2015 [employer]. This information is not intended to replace advice given to you by your health care provider. Make sure you discuss any questions you have with your health care provider. ° °Forearm Fracture °Your caregiver has diagnosed you as having a broken bone (fracture) of the forearm. This is the part of your arm between the elbow and your wrist. Your forearm is made up of two bones. These are the radius and ulna. A fracture is a break in one or both bones. A cast or splint is used to protect and keep your injured bone from moving. The cast or splint will be on generally for about 5 to 6 weeks, with individual variations. °HOME CARE INSTRUCTIONS  °· Keep the injured part elevated while sitting or lying down. Keeping the injury above the level of your heart (the center of the chest). This will decrease swelling and pain. °· Apply ice to the injury for 15-20 minutes, 03-04 times per day while awake, for 2 days. Put the ice   in a plastic bag and place a thin towel between the bag of ice and your cast or splint.  If you have a plaster or fiberglass cast:  Do not try to scratch the skin under the cast using sharp or pointed objects.  Check the skin around the cast every day. You may put lotion on any red or sore areas.  Keep your cast dry and clean.  If you have a plaster splint:  Wear the splint as directed.  You may loosen the elastic around the splint if your fingers become numb, tingle, or turn cold or blue.  Do not put pressure on any part of your cast or splint. It may break. Rest your cast only on a pillow the first 24 hours until it is fully hardened.  Your cast or splint can be protected during bathing with a plastic bag. Do not lower the cast or splint into water.  Only take  over-the-counter or prescription medicines for pain, discomfort, or fever as directed by your caregiver. SEEK IMMEDIATE MEDICAL CARE IF:   Your cast gets damaged or breaks.  You have more severe pain or swelling than you did before the cast.  Your skin or nails below the injury turn blue or gray, or feel cold or numb.  There is a bad smell or new stains and/or pus like (purulent) drainage coming from under the cast. MAKE SURE YOU:   Understand these instructions.  Will watch your condition.  Will get help right away if you are not doing well or get worse. Document Released: 02/06/2000 Document Revised: 05/03/2011 Document Reviewed: 09/28/2007 [facility] Patient Information 2015 [place], [place]. This information is not intended to replace advice given to you by your health care provider. Make sure you discuss any questions you have with your health care provider.   You may try using percocet in place of your hydrocodone which may give better pain relief.  Ice and elevate as much as possible as discussed.

## 2014-01-21 NOTE — ED Notes (Signed)
Pt was unable to remove 1 ring from ring finger.  Used K Y jelly to try to remove without success. Pt refused to let me cut the ring, says she has lotion at home she can remove it with after soaking in cold water.
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
## 2014-01-21 NOTE — ED Notes (Addendum)
Pain, swelling lt wrist and forearm.   Good radial pulse.  Pt is advised to remove her rings.  Ice pack on arm.  Larey SeatFell this am

## 2014-01-21 NOTE — ED Notes (Signed)
Faith, RT unable to locate pt in waiting area for xray at this time.

## 2014-01-21 NOTE — ED Notes (Signed)
Pt fell when walking out of her wash room. Pt presents with left arm swelling and tenderness. Pt states it is painful to move fingers. Pt has radial pulse and cap refill <3 seconds. NAD noted at this time. Pt was given ice.

## 2015-10-31 ENCOUNTER — Encounter (HOSPITAL_COMMUNITY): Payer: Self-pay | Admitting: Emergency Medicine

## 2015-10-31 DIAGNOSIS — F1423 Cocaine dependence with withdrawal: Secondary | ICD-10-CM | POA: Insufficient documentation

## 2015-10-31 DIAGNOSIS — Z7982 Long term (current) use of aspirin: Secondary | ICD-10-CM | POA: Insufficient documentation

## 2015-10-31 DIAGNOSIS — E876 Hypokalemia: Secondary | ICD-10-CM | POA: Insufficient documentation

## 2015-10-31 DIAGNOSIS — F1123 Opioid dependence with withdrawal: Secondary | ICD-10-CM | POA: Insufficient documentation

## 2015-10-31 DIAGNOSIS — F1220 Cannabis dependence, uncomplicated: Secondary | ICD-10-CM | POA: Insufficient documentation

## 2015-10-31 DIAGNOSIS — Z79899 Other long term (current) drug therapy: Secondary | ICD-10-CM | POA: Insufficient documentation

## 2015-10-31 DIAGNOSIS — F1523 Other stimulant dependence with withdrawal: Secondary | ICD-10-CM | POA: Insufficient documentation

## 2015-10-31 LAB — COMPREHENSIVE METABOLIC PANEL
ALBUMIN: 3.2 g/dL — AB (ref 3.5–5.0)
ALT: 19 U/L (ref 14–54)
ANION GAP: 9 (ref 5–15)
AST: 22 U/L (ref 15–41)
Alkaline Phosphatase: 66 U/L (ref 38–126)
BILIRUBIN TOTAL: 0.3 mg/dL (ref 0.3–1.2)
BUN: 5 mg/dL — ABNORMAL LOW (ref 6–20)
CHLORIDE: 103 mmol/L (ref 101–111)
CO2: 26 mmol/L (ref 22–32)
Calcium: 9 mg/dL (ref 8.9–10.3)
Creatinine, Ser: 0.8 mg/dL (ref 0.44–1.00)
GFR calc Af Amer: >60 mL/min (ref >60)
GFR calc non Af Amer: >60 mL/min (ref >60)
GLUCOSE: 91 mg/dL (ref 65–99)
POTASSIUM: 2.9 mmol/L — AB (ref 3.5–5.1)
Sodium: 138 mmol/L (ref 135–145)
TOTAL PROTEIN: 6.4 g/dL — AB (ref 6.5–8.1)

## 2015-10-31 LAB — CBC
HEMATOCRIT: 41.2 % (ref 36.0–46.0)
Hemoglobin: 13.6 g/dL (ref 12.0–15.0)
MCH: 31.1 pg (ref 26.0–34.0)
MCHC: 33 g/dL (ref 30.0–36.0)
MCV: 94.1 fL (ref 78.0–100.0)
Platelets: 239 10*3/uL (ref 150–400)
RBC: 4.38 MIL/uL (ref 3.87–5.11)
RDW: 12.3 % (ref 11.5–15.5)
WBC: 7.2 10*3/uL (ref 4.0–10.5)

## 2015-10-31 LAB — RAPID URINE DRUG SCREEN, HOSP PERFORMED
Amphetamines: NOT DETECTED
BARBITURATES: NOT DETECTED
Benzodiazepines: POSITIVE — AB
Cocaine: NOT DETECTED
Opiates: NOT DETECTED
TETRAHYDROCANNABINOL: NOT DETECTED

## 2015-10-31 LAB — POC URINE PREG, ED: PREG TEST UR: NEGATIVE

## 2015-10-31 LAB — ETHANOL: Alcohol, Ethyl (B): <5 mg/dL (ref <5)
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
## 2015-11-01 ENCOUNTER — Emergency Department (HOSPITAL_COMMUNITY)
Admission: EM | Admit: 2015-11-01 | Discharge: 2015-11-01 | Disposition: A | Discharge status: Home / Self Care | Payer: PRIVATE HEALTH INSURANCE | Attending: Emergency Medicine | Admitting: Emergency Medicine

## 2015-11-01 DIAGNOSIS — F1910 Other psychoactive substance abuse, uncomplicated: Secondary | ICD-10-CM

## 2015-11-01 DIAGNOSIS — E876 Hypokalemia: Secondary | ICD-10-CM

## 2015-11-01 MED ORDER — POTASSIUM CHLORIDE CRYS ER 20 MEQ PO TBCR: 20.0000 meq | EXTENDED_RELEASE_TABLET | Freq: Every day | ORAL | 0 refills | Status: DC

## 2015-11-01 MED ORDER — POTASSIUM CHLORIDE CRYS ER 20 MEQ PO TBCR
40.0000 meq | EXTENDED_RELEASE_TABLET | Freq: Once | ORAL | Status: AC
  Administered 2015-11-01: 40 meq via ORAL
  Filled 2015-11-01: qty 2

## 2015-11-01 NOTE — ED Provider Notes (Signed)
[facility]-EMERGENCY DEPT Provider Note   CSN: [id number] Arrival date & time: 10/31/15  2109     History   Chief Complaint Chief Complaint  Patient presents with  . Medical Clearance    HPI Martha Cochran is a 35 y.o. female.  HPI   35 year old female with history of bipolar, depression, chronic pain presenting requesting for detox from drugs. Patient states she was in prison for 16 months and got out of prison this past May. Since been out of prison, she has relapsed and has been using recreational drugs including marijuana, cocaine, heroine, benzos, Roxie as well as drinking alcohol. She last used Ativan last night as well as Roxicodone. Her last heroin use is 3 days ago at our last alcohol use was yesterday. She admits that she has history of bipolar and report having paranoia. She occasionally has auditory hallucination. She has not been on her psychiatric medications since been released from hospital due to lack of insurance. She denies any suicidal or homicidal ideation. She is here requesting to help with detox and states "I want to be able to take care of my kids". Patient mentioned that her sleeping and eating habit is normal. Physically she denies having any active pain such as chest pain, difficulty breathing, abdominal pain or back pain.  Past Medical History:  Diagnosis Date  . Bipolar 1 disorder (HCC)   . Bipolar disorder (HCC)   . Carpal tunnel syndrome   . Depression   . Ovarian cyst   . Ovarian cyst rupture     Patient Active Problem List   Diagnosis Date Noted  . Bipolar disorder (HCC) 05/15/2012  . Depression 05/15/2012  . Chronic pain 05/15/2012  . CTS (carpal tunnel syndrome) bilateral 05/15/2012    History reviewed. No pertinent surgical history.  OB History    Gravida Para Term Preterm AB Living   4 3 2     3    SAB TAB Ectopic Multiple Live Births           3       Home Medications    Prior to Admission medications   Medication Sig Start Date  End Date Taking? Authorizing Provider  albuterol (PROVENTIL HFA;VENTOLIN HFA) 108 (90 BASE) MCG/ACT inhaler Inhale 2 puffs into the lungs every 6 (six) hours as needed. For shortness of breath    Historical Provider, MD  amoxicillin (AMOXIL) 500 MG capsule Take 1 capsule (500 mg total) by mouth 3 (three) times daily. Patient not taking: Reported on 01/21/2014 06/26/13   Ivery Quale, PA-C  amphetamine-dextroamphetamine (ADDERALL) 20 MG tablet Take 20 mg by mouth 2 (two) times daily.      Historical Provider, MD  Aspirin-Salicylamide-Caffeine (BC HEADACHE POWDER PO) Take 1 Package by mouth daily as needed (migraine).    Historical Provider, MD  diazepam (VALIUM) 10 MG tablet Take 10 mg by mouth every 6 (six) hours as needed. Anxiety     Historical Provider, MD  FLUoxetine (PROZAC) 20 MG capsule Take 20 mg by mouth daily.      Historical Provider, MD  HYDROcodone-acetaminophen (NORCO) 10-325 MG per tablet Take 1 tablet by mouth every 6 (six) hours as needed for moderate pain.    Historical Provider, MD  ibuprofen (ADVIL,MOTRIN) 800 MG tablet Take 1 tablet (800 mg total) by mouth 3 (three) times daily. Patient not taking: Reported on 01/21/2014 06/26/13   Ivery Quale, PA-C  naproxen sodium (ANAPROX) 220 MG tablet Take 220 mg by mouth daily as needed (pain).  Historical Provider, MD  oxyCODONE-acetaminophen (PERCOCET/ROXICET) 5-325 MG per tablet Take 1-2 tablets by mouth every 4 (four) hours as needed. 01/21/14   Burgess AmorJulie Idol, PA-C  traMADol (ULTRAM) 50 MG tablet Take 1 tablet (50 mg total) by mouth 3 (three) times daily. Patient not taking: Reported on 01/21/2014 06/26/13   Ivery QualeHobson Bryant, PA-C  ziprasidone (GEODON) 20 MG capsule Take 20 mg by mouth daily.      Historical Provider, MD    Family History Family History  Problem Relation Age of Onset  . Diabetes Mother     Social History Social History  Substance Use Topics  . Smoking status: Current Every Day Smoker    Packs/day: 0.50    Types:  Cigarettes  . Smokeless tobacco: Never Used  . Alcohol use Yes     Allergies   Tramadol   Review of Systems Review of Systems  All other systems reviewed and are negative.    Physical Exam Updated Vital Signs BP 130/75   Pulse 86   Temp 98.6 F (37 C) (Oral)   Resp 17   Ht 5\' 6"  (1.676 m)   Wt 69.1 kg   LMP 10/24/2015 (Approximate)   SpO2 99%   BMI 24.57 kg/m   Physical Exam  Constitutional: She is oriented to person, place, and time. She appears well-developed and well-nourished. No distress.  Patient is well-appearing, nontoxic, in no acute distress.  HENT:  Head: Atraumatic.  Mouth/Throat: Oropharynx is clear and moist.  Eyes: Conjunctivae and EOM are normal. Pupils are equal, round, and reactive to light.  Neck: Neck supple.  Cardiovascular: Normal rate and regular rhythm.   Pulmonary/Chest: Effort normal and breath sounds normal. No respiratory distress.  Abdominal: Soft. There is no tenderness.  Neurological: She is alert and oriented to person, place, and time.  Skin: No rash noted.  Psychiatric: She has a normal mood and affect. Her speech is normal and behavior is normal. Thought content normal.  Nursing note and vitals reviewed.    ED Treatments / Results  Labs (all labs ordered are listed, but only abnormal results are displayed) Labs Reviewed  COMPREHENSIVE METABOLIC PANEL - Abnormal; Notable for the following:       Result Value   Potassium 2.9 (*)    BUN 5 (*)    Total Protein 6.4 (*)    Albumin 3.2 (*)    All other components within normal limits  URINE RAPID DRUG SCREEN, HOSP PERFORMED - Abnormal; Notable for the following:    Benzodiazepines POSITIVE (*)    All other components within normal limits  ETHANOL  CBC  POC URINE PREG, ED    EKG  EKG Interpretation None      Date: 11/01/2015  Rate: 78  Rhythm: normal sinus rhythm  QRS Axis: normal  Intervals: normal  ST/T Wave abnormalities: normal  Conduction Disutrbances:  none  Narrative Interpretation:   Old EKG Reviewed: No significant changes noted     Radiology No results found.  Procedures Procedures (including critical care time)  Medications Ordered in ED Medications - No data to display   Initial Impression / Assessment and Plan / ED Course  I have reviewed the triage vital signs and the nursing notes.  Pertinent labs & imaging results that were available during my care of the patient were reviewed by me and considered in my medical decision making (see chart for details).  Clinical Course    BP 130/75   Pulse 86   Temp 98.6 F (  37 C) (Oral)   Resp 17   Ht 5\' 6"  (1.676 m)   Wt 69.1 kg   LMP 10/24/2015 (Approximate)   SpO2 99%   BMI 24.57 kg/m    Final Clinical Impressions(s) / ED Diagnoses   Final diagnoses:  Polysubstance abuse  Hypokalemia    New Prescriptions New Prescriptions   POTASSIUM CHLORIDE SA (K-DUR,KLOR-CON) 20 MEQ TABLET    Take 1 tablet (20 mEq total) by mouth daily.   12:23 AM Patient presents requesting for detox from recreational drug use. She is not actively homicidal or suicidal. She does have a history of psychiatric disease but currently not taking medication for that. She is well-appearing. UDS is positive for benzos only. Her potassium level is low at 2.9. No active chest pain or muscle weakness. Will obtain EKG to assess for potential abnormalities. Potassium supplementation given in the ED. Anticipate discharge with outpatient resource for palpations psychiatric management and detox.  12:35 AM ECG without U-wave.  Will provide potassium supplementation at discharge.      Fayrene Helper, PA-C 11/01/15 0039    Melene Plan, DO 11/01/15 1610

## 2015-11-01 NOTE — Discharge Instructions (Signed)
Please use resources guide provided for further management of your drug abuse.  Your potassium level is low today, please take supplementation as prescribed.

## 2015-11-14 ENCOUNTER — Emergency Department (HOSPITAL_COMMUNITY): Payer: Self-pay

## 2015-11-14 ENCOUNTER — Emergency Department (HOSPITAL_COMMUNITY)
Admission: EM | Admit: 2015-11-14 | Discharge: 2015-11-14 | Disposition: A | Discharge status: Home / Self Care | Payer: Self-pay | Attending: Emergency Medicine | Admitting: Emergency Medicine

## 2015-11-14 ENCOUNTER — Encounter (HOSPITAL_COMMUNITY): Payer: Self-pay

## 2015-11-14 DIAGNOSIS — Z791 Long term (current) use of non-steroidal anti-inflammatories (NSAID): Secondary | ICD-10-CM | POA: Insufficient documentation

## 2015-11-14 DIAGNOSIS — F17210 Nicotine dependence, cigarettes, uncomplicated: Secondary | ICD-10-CM | POA: Insufficient documentation

## 2015-11-14 DIAGNOSIS — Z7982 Long term (current) use of aspirin: Secondary | ICD-10-CM | POA: Insufficient documentation

## 2015-11-14 DIAGNOSIS — K029 Dental caries, unspecified: Secondary | ICD-10-CM | POA: Insufficient documentation

## 2015-11-14 DIAGNOSIS — K0889 Other specified disorders of teeth and supporting structures: Secondary | ICD-10-CM

## 2015-11-14 DIAGNOSIS — R1011 Right upper quadrant pain: Secondary | ICD-10-CM

## 2015-11-14 DIAGNOSIS — R0789 Other chest pain: Secondary | ICD-10-CM

## 2015-11-14 LAB — TROPONIN I

## 2015-11-14 LAB — CBC WITH DIFFERENTIAL/PLATELET
Basophils Absolute: 0 10*3/uL (ref 0.0–0.1)
Basophils Relative: 0 %
EOS PCT: 4 %
Eosinophils Absolute: 0.6 10*3/uL (ref 0.0–0.7)
HCT: 41.3 % (ref 36.0–46.0)
Hemoglobin: 13.7 g/dL (ref 12.0–15.0)
LYMPHS ABS: 2.6 10*3/uL (ref 0.7–4.0)
LYMPHS PCT: 21 %
MCH: 31.1 pg (ref 26.0–34.0)
MCHC: 33.2 g/dL (ref 30.0–36.0)
MCV: 93.7 fL (ref 78.0–100.0)
Monocytes Absolute: 0.7 10*3/uL (ref 0.1–1.0)
Monocytes Relative: 6 %
Neutro Abs: 8.6 10*3/uL — ABNORMAL HIGH (ref 1.7–7.7)
Neutrophils Relative %: 69 %
PLATELETS: 231 10*3/uL (ref 150–400)
RBC: 4.41 MIL/uL (ref 3.87–5.11)
RDW: 12.9 % (ref 11.5–15.5)
WBC: 12.5 10*3/uL — AB (ref 4.0–10.5)

## 2015-11-14 LAB — PREGNANCY, URINE: PREG TEST UR: NEGATIVE

## 2015-11-14 LAB — COMPREHENSIVE METABOLIC PANEL
ALK PHOS: 60 U/L (ref 38–126)
ALT: 232 U/L — AB (ref 14–54)
AST: 29 U/L (ref 15–41)
Albumin: 3.1 g/dL — ABNORMAL LOW (ref 3.5–5.0)
Anion gap: 2 — ABNORMAL LOW (ref 5–15)
BILIRUBIN TOTAL: 0.2 mg/dL — AB (ref 0.3–1.2)
BUN: 6 mg/dL (ref 6–20)
CALCIUM: 8.4 mg/dL — AB (ref 8.9–10.3)
CHLORIDE: 103 mmol/L (ref 101–111)
CO2: 31 mmol/L (ref 22–32)
CREATININE: 0.73 mg/dL (ref 0.44–1.00)
Glucose, Bld: 81 mg/dL (ref 65–99)
Potassium: 4.3 mmol/L (ref 3.5–5.1)
Sodium: 136 mmol/L (ref 135–145)
TOTAL PROTEIN: 6 g/dL — AB (ref 6.5–8.1)

## 2015-11-14 LAB — HEPATIC FUNCTION PANEL
ALT: 242 U/L — ABNORMAL HIGH (ref 14–54)
AST: 31 U/L (ref 15–41)
Albumin: 3.2 g/dL — ABNORMAL LOW (ref 3.5–5.0)
Alkaline Phosphatase: 63 U/L (ref 38–126)
BILIRUBIN DIRECT: 0.1 mg/dL (ref 0.1–0.5)
BILIRUBIN TOTAL: 0.5 mg/dL (ref 0.3–1.2)
Indirect Bilirubin: 0.4 mg/dL (ref 0.3–0.9)
Total Protein: 6 g/dL — ABNORMAL LOW (ref 6.5–8.1)

## 2015-11-14 LAB — ACETAMINOPHEN LEVEL: Acetaminophen (Tylenol), Serum: <10 ug/mL — ABNORMAL LOW (ref 10–30)

## 2015-11-14 MED ORDER — OMEPRAZOLE 20 MG PO CPDR: 20.0000 mg | DELAYED_RELEASE_CAPSULE | Freq: Every day | ORAL | 0 refills | Status: DC

## 2015-11-14 MED ORDER — NAPROXEN 500 MG PO TABS: 500.0000 mg | ORAL_TABLET | Freq: Two times a day (BID) | ORAL | 0 refills | Status: DC

## 2015-11-14 MED ORDER — PENICILLIN V POTASSIUM 500 MG PO TABS: 500.0000 mg | ORAL_TABLET | Freq: Four times a day (QID) | ORAL | 0 refills | Status: AC

## 2015-11-14 MED ORDER — IBUPROFEN 800 MG PO TABS
800.0000 mg | ORAL_TABLET | Freq: Once | ORAL | Status: AC
  Administered 2015-11-14: 800 mg via ORAL
  Filled 2015-11-14: qty 1

## 2015-11-14 NOTE — ED Notes (Signed)
Dr. Manus Gunningancour notified that pt would like pain medication.

## 2015-11-14 NOTE — ED Notes (Signed)
Pt requesting something for her dental pain, EDP made aware

## 2015-11-14 NOTE — ED Provider Notes (Signed)
[facility]-EMERGENCY DEPT Provider Note   CSN: [id number] Arrival date & time: 11/14/15  1124  By signing my name below, I, Martha Cochran, attest that this documentation has been prepared under the direction and in the presence of Martha Octave, MD. Electronically Signed: Soijett Cochran, ED Scribe. 11/14/15. 12:21 PM.    History   Chief Complaint Chief Complaint  Patient presents with  . Chest Pain  . Dental Pain    HPI  Martha Cochran is a 35 y.o. female who presents to the Emergency Department complaining of intermittent sternal CP onset 1 week. Pt reports that she was washing clothes when she had the initial episode of CP. Pt reports that when she has the CP episodes, it will not last longer than 1-2 minutes.  Pt states that her sternal CP radiates to her left shoulder and it is worsened with activity. Denies alleviating factors. Pt notes that she has had a lot of stressors recently. Pt denies having CP at this time. She states that she is having associated symptoms of SOB and diaphoresis. She states that she has tried ASA with no relief for her symptoms. She denies breast pain, lumps to breast, and any other symptoms. Denies gastric ulcers, abdominal issues, MI, cardiac issues, DM, or HTN. Pt reports that she last used IV drugs in 1998. Denies ever having a cardiac stress test completed.   Pt secondarily complaining of intermittent right upper dental pain onset 2 years. Pt reports that her right upper tooth has been slowing breaking over the course of 2 years. Pt denies having a dentist at this time. She states that she is having associated symptoms of subjective fever and rouble talking/swallowing due to pain. She states that she has tried tylenol and BC powder with no relief for her symptoms. She denies abdominal pain, and any other symptoms.    The history is provided by the patient. No language interpreter was used.    Past Medical History:  Diagnosis Date  . Bipolar 1 disorder  (HCC)   . Bipolar disorder (HCC)   . Carpal tunnel syndrome   . Depression   . Ovarian cyst   . Ovarian cyst rupture     Patient Active Problem List   Diagnosis Date Noted  . Bipolar disorder (HCC) 05/15/2012  . Depression 05/15/2012  . Chronic pain 05/15/2012  . CTS (carpal tunnel syndrome) bilateral 05/15/2012    History reviewed. No pertinent surgical history.  OB History    Gravida Para Term Preterm AB Living   4 3 2     3    SAB TAB Ectopic Multiple Live Births           3       Home Medications    Prior to Admission medications   Medication Sig Start Date End Date Taking? Authorizing Provider  albuterol (PROVENTIL HFA;VENTOLIN HFA) 108 (90 BASE) MCG/ACT inhaler Inhale 2 puffs into the lungs every 6 (six) hours as needed. For shortness of breath    Historical Provider, MD  amoxicillin (AMOXIL) 500 MG capsule Take 1 capsule (500 mg total) by mouth 3 (three) times daily. Patient not taking: Reported on 01/21/2014 06/26/13   Ivery Quale, PA-C  amphetamine-dextroamphetamine (ADDERALL) 20 MG tablet Take 20 mg by mouth 2 (two) times daily.      Historical Provider, MD  Aspirin-Salicylamide-Caffeine (BC HEADACHE POWDER PO) Take 1 Package by mouth daily as needed (migraine).    Historical Provider, MD  diazepam (VALIUM) 10 MG tablet Take 10  mg by mouth every 6 (six) hours as needed. Anxiety     Historical Provider, MD  FLUoxetine (PROZAC) 20 MG capsule Take 20 mg by mouth daily.      Historical Provider, MD  HYDROcodone-acetaminophen (NORCO) 10-325 MG per tablet Take 1 tablet by mouth every 6 (six) hours as needed for moderate pain.    Historical Provider, MD  ibuprofen (ADVIL,MOTRIN) 800 MG tablet Take 1 tablet (800 mg total) by mouth 3 (three) times daily. Patient not taking: Reported on 01/21/2014 06/26/13   Ivery Quale, PA-C  naproxen sodium (ANAPROX) 220 MG tablet Take 220 mg by mouth daily as needed (pain).    Historical Provider, MD  oxyCODONE-acetaminophen  (PERCOCET/ROXICET) 5-325 MG per tablet Take 1-2 tablets by mouth every 4 (four) hours as needed. 01/21/14   Burgess Amor, PA-C  potassium chloride SA (K-DUR,KLOR-CON) 20 MEQ tablet Take 1 tablet (20 mEq total) by mouth daily. 11/01/15   Fayrene Helper, PA-C  traMADol (ULTRAM) 50 MG tablet Take 1 tablet (50 mg total) by mouth 3 (three) times daily. Patient not taking: Reported on 01/21/2014 06/26/13   Ivery Quale, PA-C  ziprasidone (GEODON) 20 MG capsule Take 20 mg by mouth daily.      Historical Provider, MD    Family History Family History  Problem Relation Age of Onset  . Diabetes Mother     Social History Social History  Substance Use Topics  . Smoking status: Current Every Day Smoker    Packs/day: 0.50    Types: Cigarettes  . Smokeless tobacco: Never Used  . Alcohol use Yes     Comment: occ     Allergies   Tramadol   Review of Systems Review of Systems A complete 10 system review of systems was obtained and all systems are negative except as noted in the HPI and PMH.    Physical Exam Updated Vital Signs BP 158/95 (BP Location: Left Arm)   Pulse 98   Temp 98.5 F (36.9 C) (Oral)   Resp 20   Ht 5\' 6"  (1.676 m)   Wt 150 lb (68 kg)   LMP 09/23/2015 (Approximate)   SpO2 99%   BMI 24.21 kg/m   Physical Exam  Constitutional: She is oriented to person, place, and time. She appears well-developed and well-nourished. No distress.  HENT:  Head: Normocephalic and atraumatic.  Mouth/Throat: Oropharynx is clear and moist and mucous membranes are normal. No trismus in the jaw. Abnormal dentition. Dental caries present. No dental abscesses. No oropharyngeal exudate.  Missing multiple teeth. Right second molar is longitudinally fractured. Floor of mouth soft. No abscess. No trismus.   Eyes: Conjunctivae and EOM are normal. Pupils are equal, round, and reactive to light.  Neck: Normal range of motion. Neck supple.  No meningismus.  Cardiovascular: Normal rate, regular rhythm,  normal heart sounds and intact distal pulses.   No murmur heard. Pulmonary/Chest: Effort normal and breath sounds normal. No respiratory distress.  CTAB. Chest wall non-tender.   Abdominal: Soft. There is no tenderness. There is no rebound and no guarding.  Musculoskeletal: Normal range of motion. She exhibits no edema or tenderness.  Neurological: She is alert and oriented to person, place, and time. No cranial nerve deficit. She exhibits normal muscle tone. Coordination normal.   5/5 strength throughout. CN 2-12 intact.Equal grip strength.   Skin: Skin is warm.  Psychiatric: She has a normal mood and affect. Her behavior is normal.  Nursing note and vitals reviewed.    ED Treatments /  Results  DIAGNOSTIC STUDIES: Oxygen Saturation is 99% on RA, nl by my interpretation.    COORDINATION OF CARE: 12:06 PM Discussed treatment plan with pt at bedside which includes EKG, UA, CXR, labs, and pt agreed to plan.  12:07 PM- Pt offered breast exam to which she deferred.   Labs (all labs ordered are listed, but only abnormal results are displayed) Labs Reviewed  CBC WITH DIFFERENTIAL/PLATELET - Abnormal; Notable for the following:       Result Value   WBC 12.5 (*)    Neutro Abs 8.6 (*)    All other components within normal limits  COMPREHENSIVE METABOLIC PANEL - Abnormal; Notable for the following:    Calcium 8.4 (*)    Total Protein 6.0 (*)    Albumin 3.1 (*)    ALT 232 (*)    Total Bilirubin 0.2 (*)    Anion gap 2 (*)    All other components within normal limits  ACETAMINOPHEN LEVEL - Abnormal; Notable for the following:    Acetaminophen (Tylenol), Serum <10 (*)    All other components within normal limits  HEPATIC FUNCTION PANEL - Abnormal; Notable for the following:    Total Protein 6.0 (*)    Albumin 3.2 (*)    ALT 242 (*)    All other components within normal limits  PREGNANCY, URINE  TROPONIN I  TROPONIN I    EKG  EKG Interpretation  Date/Time:  Friday November 14 2015 11:39:51 EDT Ventricular Rate:  92 PR Interval:    QRS Duration: 87 QT Interval:  361 QTC Calculation: 447 R Axis:   80 Text Interpretation:  Sinus rhythm Borderline short PR interval Baseline wander in lead(s) II III aVF No significant change was found Confirmed by Manus Gunning  MD, Montario Zilka [phone]) on 11/14/2015 12:15:57 PM       Radiology Dg Chest 2 View  Result Date: 11/14/2015 CLINICAL DATA:  Chest pain EXAM: CHEST  2 VIEW COMPARISON:  06/24/2012 FINDINGS: The heart size and mediastinal contours are within normal limits. Both lungs are clear. Chronic fracture distal left clavicle without bony union. IMPRESSION: No active cardiopulmonary disease. Electronically Signed   By: Marlan Palau M.D.   On: 11/14/2015 13:05   US Abdomen Limited Ruq  Result Date: 11/14/2015 CLINICAL DATA:  Right upper quadrant pain for 1 week. EXAM: US ABDOMEN LIMITED - RIGHT UPPER QUADRANT COMPARISON:  CT abdomen and pelvis 06/22/2012. FINDINGS: Gallbladder: No gallstones or wall thickening visualized. No sonographic Murphy sign noted by sonographer. Common bile duct: Diameter: 0.5 cm Liver: Within normal limits in parenchymal echogenicity. Somewhat ill-defined area of increased echogenicity in the left hepatic lobe could be focal fat or less likely hemangioma. No hemangioma is seen on the prior exams. IMPRESSION: Negative for gallstones.  No acute abnormality. Electronically Signed   By: Drusilla Kanner M.D.   On: 11/14/2015 16:08    Procedures Procedures (including critical care time)  Medications Ordered in ED Medications - No data to display   Initial Impression / Assessment and Plan / ED Course  I have reviewed the triage vital signs and the nursing notes.  Pertinent labs & imaging results that were available during my care of the patient were reviewed by me and considered in my medical decision making (see chart for details).  Clinical Course   UPper dental pain. No evidence of ludwig's angina or  abscess.  Intermittent chest pain lasting for 1-2 minutes at a time.  No chest pain currently.  EKG nonischemic.  Troponin negative. Contrary to triage note, denies breast pain, declines breast exam.  HCG negative.  Labs shows nonspecific ALT elevation with other LFTs normal.  Patient denies any alcohol use was several days ago. Denies any excessive or recent APAP use. APAP level undetectable.  RUQ US negative. Patient states no IV drug use since 1998.  Troponin negative x2. Low suspicion for ACS or PE. Followup with dentist as well as GI and PCP for abnormal LFTs. Return precautions discussed. Final Clinical Impressions(s) / ED Diagnoses   Final diagnoses:  RUQ pain  Pain, dental  Atypical chest pain    New Prescriptions New Prescriptions   No medications on file    I personally performed the services described in this documentation, which was scribed in my presence. The recorded information has been reviewed and is accurate.     Martha OctaveStephen Renald Haithcock, MD 11/14/15 [phone]

## 2015-11-14 NOTE — Discharge Instructions (Signed)
Follow up with the dentist. Also follow up with the GI doctor to evaluate your elevated liver function. Return to the ED if you develop new or worsening symptoms.
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
## 2015-12-13 ENCOUNTER — Emergency Department (HOSPITAL_COMMUNITY)
Admission: EM | Admit: 2015-12-13 | Discharge: 2015-12-13 | Disposition: A | Discharge status: Home / Self Care | Payer: No Typology Code available for payment source | Attending: Emergency Medicine | Admitting: Emergency Medicine

## 2015-12-13 ENCOUNTER — Emergency Department (HOSPITAL_COMMUNITY): Payer: No Typology Code available for payment source

## 2015-12-13 ENCOUNTER — Encounter (HOSPITAL_COMMUNITY): Payer: Self-pay | Admitting: Emergency Medicine

## 2015-12-13 DIAGNOSIS — Y999 Unspecified external cause status: Secondary | ICD-10-CM | POA: Insufficient documentation

## 2015-12-13 DIAGNOSIS — M79642 Pain in left hand: Secondary | ICD-10-CM | POA: Diagnosis not present

## 2015-12-13 DIAGNOSIS — Y939 Activity, unspecified: Secondary | ICD-10-CM | POA: Insufficient documentation

## 2015-12-13 DIAGNOSIS — Y92410 Unspecified street and highway as the place of occurrence of the external cause: Secondary | ICD-10-CM | POA: Insufficient documentation

## 2015-12-13 DIAGNOSIS — Z7982 Long term (current) use of aspirin: Secondary | ICD-10-CM | POA: Insufficient documentation

## 2015-12-13 DIAGNOSIS — F17210 Nicotine dependence, cigarettes, uncomplicated: Secondary | ICD-10-CM | POA: Diagnosis not present

## 2015-12-13 DIAGNOSIS — Z79899 Other long term (current) drug therapy: Secondary | ICD-10-CM | POA: Diagnosis not present

## 2015-12-13 DIAGNOSIS — E876 Hypokalemia: Secondary | ICD-10-CM | POA: Diagnosis not present

## 2015-12-13 DIAGNOSIS — M549 Dorsalgia, unspecified: Secondary | ICD-10-CM | POA: Insufficient documentation

## 2015-12-13 DIAGNOSIS — M542 Cervicalgia: Secondary | ICD-10-CM

## 2015-12-13 LAB — I-STAT CHEM 8, ED
BUN: 3 mg/dL — ABNORMAL LOW (ref 6–20)
CALCIUM ION: 1.13 mmol/L — AB (ref 1.15–1.40)
Chloride: 98 mmol/L — ABNORMAL LOW (ref 101–111)
Creatinine, Ser: 0.6 mg/dL (ref 0.44–1.00)
Glucose, Bld: 98 mg/dL (ref 65–99)
HEMATOCRIT: 37 % (ref 36.0–46.0)
HEMOGLOBIN: 12.6 g/dL (ref 12.0–15.0)
Potassium: 3.1 mmol/L — ABNORMAL LOW (ref 3.5–5.1)
SODIUM: 138 mmol/L (ref 135–145)
TCO2: 26 mmol/L (ref 0–100)

## 2015-12-13 MED ORDER — CYCLOBENZAPRINE HCL 10 MG PO TABS: 10.0000 mg | ORAL_TABLET | Freq: Two times a day (BID) | ORAL | 0 refills | Status: DC | PRN

## 2015-12-13 MED ORDER — POTASSIUM CHLORIDE CRYS ER 20 MEQ PO TBCR: 40.0000 meq | EXTENDED_RELEASE_TABLET | Freq: Two times a day (BID) | ORAL | 0 refills | Status: DC

## 2015-12-13 MED ORDER — POTASSIUM CHLORIDE CRYS ER 20 MEQ PO TBCR
40.0000 meq | EXTENDED_RELEASE_TABLET | Freq: Once | ORAL | Status: AC
  Administered 2015-12-13: 40 meq via ORAL
  Filled 2015-12-13: qty 2

## 2015-12-13 MED ORDER — IBUPROFEN 400 MG PO TABS: 400.0000 mg | ORAL_TABLET | Freq: Four times a day (QID) | ORAL | 0 refills | Status: DC | PRN

## 2015-12-13 MED ORDER — CYCLOBENZAPRINE HCL 10 MG PO TABS
10.0000 mg | ORAL_TABLET | Freq: Once | ORAL | Status: AC
  Administered 2015-12-13: 10 mg via ORAL
  Filled 2015-12-13: qty 1

## 2015-12-13 MED ORDER — KETOROLAC TROMETHAMINE 60 MG/2ML IM SOLN
60.0000 mg | Freq: Once | INTRAMUSCULAR | Status: AC
  Administered 2015-12-13: 60 mg via INTRAMUSCULAR
  Filled 2015-12-13: qty 2
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
## 2015-12-13 NOTE — ED Notes (Signed)
Patient transported to X-ray 
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
## 2015-12-13 NOTE — ED Notes (Signed)
Pt returned from X Ray.

## 2015-12-22 ENCOUNTER — Encounter (HOSPITAL_COMMUNITY): Payer: Self-pay | Admitting: Emergency Medicine

## 2015-12-22 ENCOUNTER — Emergency Department (HOSPITAL_COMMUNITY)
Admission: EM | Admit: 2015-12-22 | Discharge: 2015-12-22 | Disposition: A | Discharge status: Home / Self Care | Payer: No Typology Code available for payment source | Attending: Emergency Medicine | Admitting: Emergency Medicine

## 2015-12-22 ENCOUNTER — Emergency Department (HOSPITAL_COMMUNITY): Payer: No Typology Code available for payment source

## 2015-12-22 DIAGNOSIS — R519 Headache, unspecified: Secondary | ICD-10-CM

## 2015-12-22 DIAGNOSIS — Z7982 Long term (current) use of aspirin: Secondary | ICD-10-CM | POA: Diagnosis not present

## 2015-12-22 DIAGNOSIS — F17210 Nicotine dependence, cigarettes, uncomplicated: Secondary | ICD-10-CM | POA: Diagnosis not present

## 2015-12-22 DIAGNOSIS — R51 Headache: Secondary | ICD-10-CM | POA: Insufficient documentation

## 2015-12-22 DIAGNOSIS — M542 Cervicalgia: Secondary | ICD-10-CM | POA: Diagnosis not present

## 2015-12-22 MED ORDER — METHOCARBAMOL 500 MG PO TABS: 1000.0000 mg | ORAL_TABLET | Freq: Four times a day (QID) | ORAL | 0 refills | Status: DC | PRN

## 2015-12-22 MED ORDER — METOCLOPRAMIDE HCL 10 MG PO TABS: 10.0000 mg | ORAL_TABLET | Freq: Four times a day (QID) | ORAL | 0 refills | Status: DC | PRN

## 2015-12-22 MED ORDER — KETOROLAC TROMETHAMINE 60 MG/2ML IM SOLN
60.0000 mg | Freq: Once | INTRAMUSCULAR | Status: AC
Start: 2015-12-22 — End: 2015-12-22
  Administered 2015-12-22: 60 mg via INTRAMUSCULAR
  Filled 2015-12-22: qty 2

## 2015-12-22 MED ORDER — PROMETHAZINE HCL 25 MG/ML IJ SOLN
25.0000 mg | Freq: Once | INTRAMUSCULAR | Status: AC
  Administered 2015-12-22: 25 mg via INTRAMUSCULAR
  Filled 2015-12-22: qty 1

## 2015-12-22 MED ORDER — NAPROXEN 250 MG PO TABS: 250.0000 mg | ORAL_TABLET | Freq: Two times a day (BID) | ORAL | 0 refills | Status: DC | PRN

## 2015-12-22 MED ORDER — DIPHENHYDRAMINE HCL 50 MG/ML IJ SOLN
50.0000 mg | Freq: Once | INTRAMUSCULAR | Status: AC
  Administered 2015-12-22: 50 mg via INTRAMUSCULAR
  Filled 2015-12-22: qty 1

## 2015-12-22 NOTE — Discharge Instructions (Signed)
Take the prescriptions as directed.  Also take over the counter tylenol, as well as benadryl, as directed on packaging, to treat your headache. Apply moist heat or ice to the area(s) of discomfort, for 15 minutes at a time, several times per day for the next few days.  Do not fall asleep on a heating or ice pack.  Call your regular medical doctor tomorrow to schedule a follow up appointment this week.  Return to the Emergency Department immediately if worsening.
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
## 2017-05-27 ENCOUNTER — Encounter (HOSPITAL_COMMUNITY): Payer: Self-pay | Admitting: Emergency Medicine

## 2017-05-27 ENCOUNTER — Emergency Department (HOSPITAL_COMMUNITY)
Admission: EM | Admit: 2017-05-27 | Discharge: 2017-05-27 | Disposition: A | Discharge status: Home / Self Care | Payer: Medicaid Other | Attending: Emergency Medicine | Admitting: Emergency Medicine

## 2017-05-27 ENCOUNTER — Other Ambulatory Visit: Payer: Self-pay

## 2017-05-27 DIAGNOSIS — L989 Disorder of the skin and subcutaneous tissue, unspecified: Secondary | ICD-10-CM | POA: Insufficient documentation

## 2017-05-27 DIAGNOSIS — Z5321 Procedure and treatment not carried out due to patient leaving prior to being seen by health care provider: Secondary | ICD-10-CM | POA: Diagnosis not present

## 2017-05-27 NOTE — ED Notes (Signed)
Received call from registration stating pt left.

## 2017-05-27 NOTE — ED Triage Notes (Signed)
Patient with multiple skin lesions. Large areas on her breasts. Patient states the areas started about 2 weeks ago. Patient reports the areas look like they have some type of parasite coming out. Patient also reports something coming out of her hair when she combs it but states it is not lice.

## 2017-06-11 ENCOUNTER — Encounter (HOSPITAL_COMMUNITY): Payer: Self-pay | Admitting: Emergency Medicine

## 2017-06-11 ENCOUNTER — Emergency Department (HOSPITAL_COMMUNITY)
Admission: EM | Admit: 2017-06-11 | Discharge: 2017-06-11 | Disposition: A | Discharge status: Home / Self Care | Payer: Medicaid Other | Attending: Emergency Medicine | Admitting: Emergency Medicine

## 2017-06-11 DIAGNOSIS — B373 Candidiasis of vulva and vagina: Secondary | ICD-10-CM | POA: Insufficient documentation

## 2017-06-11 DIAGNOSIS — Z79899 Other long term (current) drug therapy: Secondary | ICD-10-CM | POA: Diagnosis not present

## 2017-06-11 DIAGNOSIS — N390 Urinary tract infection, site not specified: Secondary | ICD-10-CM | POA: Diagnosis not present

## 2017-06-11 DIAGNOSIS — F17210 Nicotine dependence, cigarettes, uncomplicated: Secondary | ICD-10-CM | POA: Insufficient documentation

## 2017-06-11 DIAGNOSIS — B379 Candidiasis, unspecified: Secondary | ICD-10-CM

## 2017-06-11 DIAGNOSIS — N898 Other specified noninflammatory disorders of vagina: Secondary | ICD-10-CM | POA: Diagnosis present

## 2017-06-11 LAB — URINALYSIS, ROUTINE W REFLEX MICROSCOPIC
GLUCOSE, UA: NEGATIVE mg/dL
Hgb urine dipstick: NEGATIVE
Ketones, ur: 5 mg/dL — AB
Nitrite: NEGATIVE
PROTEIN: 30 mg/dL — AB
Specific Gravity, Urine: 1.027 (ref 1.005–1.030)
pH: 5 (ref 5.0–8.0)

## 2017-06-11 LAB — WET PREP, GENITAL
Clue Cells Wet Prep HPF POC: NONE SEEN
SPERM: NONE SEEN
Trich, Wet Prep: NONE SEEN
WBC, Wet Prep HPF POC: NONE SEEN
YEAST WET PREP: NONE SEEN

## 2017-06-11 LAB — CBC WITH DIFFERENTIAL/PLATELET
Basophils Absolute: 0 10*3/uL (ref 0.0–0.1)
Basophils Relative: 0 %
EOS PCT: 5 %
Eosinophils Absolute: 0.3 10*3/uL (ref 0.0–0.7)
HEMATOCRIT: 43.8 % (ref 36.0–46.0)
Hemoglobin: 14.2 g/dL (ref 12.0–15.0)
LYMPHS PCT: 33 %
Lymphs Abs: 1.7 10*3/uL (ref 0.7–4.0)
MCH: 30.5 pg (ref 26.0–34.0)
MCHC: 32.4 g/dL (ref 30.0–36.0)
MCV: 94 fL (ref 78.0–100.0)
MONO ABS: 0.4 10*3/uL (ref 0.1–1.0)
MONOS PCT: 7 %
NEUTROS ABS: 2.9 10*3/uL (ref 1.7–7.7)
Neutrophils Relative %: 55 %
PLATELETS: 283 10*3/uL (ref 150–400)
RBC: 4.66 MIL/uL (ref 3.87–5.11)
RDW: 12.9 % (ref 11.5–15.5)
WBC: 5.2 10*3/uL (ref 4.0–10.5)

## 2017-06-11 LAB — BASIC METABOLIC PANEL
ANION GAP: 9 (ref 5–15)
BUN: 11 mg/dL (ref 6–20)
CALCIUM: 8.9 mg/dL (ref 8.9–10.3)
CHLORIDE: 101 mmol/L (ref 101–111)
CO2: 28 mmol/L (ref 22–32)
Creatinine, Ser: 0.76 mg/dL (ref 0.44–1.00)
GFR calc Af Amer: >60 mL/min (ref >60)
GFR calc non Af Amer: >60 mL/min (ref >60)
Glucose, Bld: 88 mg/dL (ref 65–99)
Potassium: 3.5 mmol/L (ref 3.5–5.1)
SODIUM: 138 mmol/L (ref 135–145)

## 2017-06-11 LAB — RAPID URINE DRUG SCREEN, HOSP PERFORMED
Amphetamines: POSITIVE — AB
BARBITURATES: NOT DETECTED
BENZODIAZEPINES: POSITIVE — AB
COCAINE: NOT DETECTED
OPIATES: POSITIVE — AB
Tetrahydrocannabinol: NOT DETECTED

## 2017-06-11 LAB — PREGNANCY, URINE: Preg Test, Ur: NEGATIVE

## 2017-06-11 MED ORDER — SULFAMETHOXAZOLE-TRIMETHOPRIM 800-160 MG PO TABS: 1.0000 | ORAL_TABLET | Freq: Two times a day (BID) | ORAL | 0 refills | Status: DC

## 2017-06-11 MED ORDER — FLUCONAZOLE 150 MG PO TABS
150.0000 mg | ORAL_TABLET | Freq: Once | ORAL | Status: AC
  Administered 2017-06-11: 150 mg via ORAL
  Filled 2017-06-11: qty 1

## 2017-06-11 MED ORDER — CLOTRIMAZOLE 1 % EX CREA: TOPICAL_CREAM | CUTANEOUS | 0 refills | Status: DC

## 2017-06-11 NOTE — ED Provider Notes (Signed)
[facility] EMERGENCY DEPARTMENT Provider Note   CSN: [id number] Arrival date & time: 06/11/17  1255     History   Chief Complaint Chief Complaint  Patient presents with  . Vaginal Discharge    HPI Martha Cochran is a 37 y.o. female.  HPI Patient presents with multiple complaints.  States she has vaginal discharge and smell.  States she thinks she could have a yeast infection.  States she normally does not have any discharge even when she goes 5 days without taking a bath.  She also thinks that her boyfriend could have lice.  States that he had something in his hair and had new white bumps on his scrotum.  States she has had lice shampoo and has not seen anything in her hair.  Also states she has had rash or lesions on her breasts and body.  States it is starts a small area and then gets larger.  Has one on each breast to have some on her hand.  States it is more painful than giving birth.  Denies drug abuse states she is on Adderall Suboxone and Klonopin.  Former history of drug abuse however.  Also states her urine smells bad.  Also states she gets itchiness by her but.  States she looked about the rash on the Internet and all she could find was [place].  She states that she pulls hair out of the lesion and also could be a parasite or something gray. Past Medical History:  Diagnosis Date  . Bipolar 1 disorder (HCC)   . Bipolar disorder (HCC)   . Carpal tunnel syndrome   . Depression   . Ovarian cyst   . Ovarian cyst rupture     Patient Active Problem List   Diagnosis Date Noted  . Bipolar disorder (HCC) 05/15/2012  . Depression 05/15/2012  . Chronic pain 05/15/2012  . CTS (carpal tunnel syndrome) bilateral 05/15/2012    History reviewed. No pertinent surgical history.   OB History    Gravida  4   Para  3   Term  2   Preterm      AB      Living  3     SAB      TAB      Ectopic      Multiple      Live Births  3            Home Medications      Prior to Admission medications   Medication Sig Start Date End Date Taking? Authorizing Provider  amphetamine-dextroamphetamine (ADDERALL) 20 MG tablet Take 20 mg by mouth 2 (two) times daily.     Yes [provider]  buprenorphine-naloxone (SUBOXONE) 8-2 mg SUBL SL tablet Place 1 tablet under the tongue daily.   Yes [provider]  clonazePAM (KLONOPIN) 0.5 MG tablet Take 0.5 mg by mouth 2 (two) times daily as needed for anxiety.   Yes [provider]  diazepam (VALIUM) 10 MG tablet Take 10 mg by mouth every 6 (six) hours as needed. Anxiety    Yes [provider]  clotrimazole (LOTRIMIN) 1 % cream Apply to affected area 2 times daily 06/11/17   Benjiman Core, MD  sulfamethoxazole-trimethoprim (BACTRIM DS,SEPTRA DS) 800-160 MG tablet Take 1 tablet by mouth 2 (two) times daily. 06/11/17   Benjiman Core, MD    Family History Family History  Problem Relation Age of Onset  . Diabetes Mother     Social History Social History  Tobacco Use  . Smoking status: Current Every Day Smoker    Packs/day: 0.50    Types: Cigarettes, E-cigarettes  . Smokeless tobacco: Never Used  Substance Use Topics  . Alcohol use: Not Currently    Comment: occ  . Drug use: Not Currently    Types: Benzodiazepines, Amphetamines, Marijuana, Cocaine    Comment: Opiates. - says used all 1 1/2 months ago. Patient denies today 12/22/2015     Allergies   Penicillins and Tramadol   Review of Systems Review of Systems  Constitutional: Negative for appetite change and fever.  HENT: Negative for congestion.   Respiratory: Negative for shortness of breath.   Cardiovascular: Negative for chest pain.  Gastrointestinal: Negative for abdominal pain.  Endocrine: Negative for polyuria.  Genitourinary: Positive for dysuria and vaginal discharge. Negative for vaginal bleeding.  Musculoskeletal: Negative for joint swelling.  Skin: Positive for rash and wound.  Neurological:  Negative for tremors.  Psychiatric/Behavioral: Negative for confusion.     Physical Exam Updated Vital Signs BP 105/74 (BP Location: Left Arm)   Pulse (!) 109   Temp (!) 97.5 F (36.4 C) (Oral)   Resp 18   Ht 5\' 5"  (1.651 m)   Wt 68.5 kg (151 lb)   LMP 05/20/2017   SpO2 100%   BMI 25.13 kg/m   Physical Exam  Constitutional: She appears well-developed.  HENT:  Head: Atraumatic.  No lice or nits seen in hair.  Eyes: EOM are normal.  Neck: Neck supple.  Cardiovascular: Normal rate.  Pulmonary/Chest: Effort normal.  Abdominal: There is no tenderness.  Genitourinary: Vagina normal. No vaginal discharge found.  Genitourinary Comments: No adnexal tenderness.  Musculoskeletal: She exhibits no tenderness.  Neurological: She is alert.  Skin: Capillary refill takes less than 2 seconds.  Right breast has approximately 5 mm slightly raised scab on it.  No drainage.  Left breast has approximately 2.5 cm erythematous ulcer on it.  No drainage.  Some granulation tissue.  Right palm and right index finger have another smaller approximately 3 mm rounded lesion.  Abrasion horizontally across left lower abdomen.     ED Treatments / Results  Labs (all labs ordered are listed, but only abnormal results are displayed) Labs Reviewed  URINALYSIS, ROUTINE W REFLEX MICROSCOPIC - Abnormal; Notable for the following components:      Result Value   Color, Urine AMBER (*)    APPearance CLOUDY (*)    Bilirubin Urine SMALL (*)    Ketones, ur 5 (*)    Protein, ur 30 (*)    Leukocytes, UA SMALL (*)    Bacteria, UA MANY (*)    Squamous Epithelial / LPF TOO NUMEROUS TO COUNT (*)    All other components within normal limits  RAPID URINE DRUG SCREEN, HOSP PERFORMED - Abnormal; Notable for the following components:   Opiates POSITIVE (*)    Benzodiazepines POSITIVE (*)    Amphetamines POSITIVE (*)    All other components within normal limits  WET PREP, GENITAL  PREGNANCY, URINE  BASIC METABOLIC  PANEL  CBC WITH DIFFERENTIAL/PLATELET  RPR  HIV ANTIBODY (ROUTINE TESTING)  GC/CHLAMYDIA PROBE AMP ([place]) NOT AT [facility]    EKG None  Radiology No results found.  Procedures Procedures (including critical care time)  Medications Ordered in ED Medications  fluconazole (DIFLUCAN) tablet 150 mg (has no administration in time range)     Initial Impression / Assessment and Plan / ED Course  I have reviewed the triage vital signs and  the nursing notes.  Pertinent labs & imaging results that were available during my care of the patient were reviewed by me and considered in my medical decision making (see chart for details).     Patient with Candida in urine and possible urinary tract infection.  Thought she may have lice but no nits seen.  Skin lesions nonspecific and will give topical antifungal and will need follow-up with PCP.  Other STD testing pending.  Drug database reviewed and drug screen which is consistent with opiates amphetamines and benzos, which the patient is prescribed.  Although Suboxone usually will not show up as an opiate on drug screen. Final Clinical Impressions(s) / ED Diagnoses   Final diagnoses:  Candidiasis  Lower urinary tract infectious disease    ED Discharge Orders        Ordered    clotrimazole (LOTRIMIN) 1 % cream     06/11/17 1502    sulfamethoxazole-trimethoprim (BACTRIM DS,SEPTRA DS) 800-160 MG tablet  2 times daily     06/11/17 1502       Benjiman Core, MD 06/11/17 1516

## 2017-06-11 NOTE — Discharge Instructions (Signed)
Follow-up with your doctor for further evaluation of the skin lesions.  No STDs showed up on the exam, however testing for gonorrhea chlamydia HIV and syphilis is still pending.

## 2017-06-11 NOTE — ED Triage Notes (Signed)
Patient present complaining of multiple skin lesions most notably on her left breast. She states odor and discharge from her vagina 2 days ago and states she missed her last period. She states that her boyfriend has white lesions around his groin and reports he has lice.

## 2017-06-12 LAB — RPR: RPR Ser Ql: NONREACTIVE

## 2017-06-12 LAB — HIV ANTIBODY (ROUTINE TESTING W REFLEX): HIV Screen 4th Generation wRfx: NONREACTIVE

## 2017-06-13 LAB — GC/CHLAMYDIA PROBE AMP (~~LOC~~) NOT AT ARMC
CHLAMYDIA, DNA PROBE: NEGATIVE
NEISSERIA GONORRHEA: NEGATIVE

## 2017-10-20 ENCOUNTER — Emergency Department (HOSPITAL_COMMUNITY)
Admission: EM | Admit: 2017-10-20 | Discharge: 2017-10-20 | Disposition: A | Discharge status: Home / Self Care | Payer: Medicaid Other | Attending: Emergency Medicine | Admitting: Emergency Medicine

## 2017-10-20 ENCOUNTER — Other Ambulatory Visit: Payer: Self-pay

## 2017-10-20 ENCOUNTER — Encounter (HOSPITAL_COMMUNITY): Payer: Self-pay | Admitting: Emergency Medicine

## 2017-10-20 DIAGNOSIS — N3090 Cystitis, unspecified without hematuria: Secondary | ICD-10-CM

## 2017-10-20 DIAGNOSIS — F17210 Nicotine dependence, cigarettes, uncomplicated: Secondary | ICD-10-CM | POA: Diagnosis not present

## 2017-10-20 DIAGNOSIS — Z79899 Other long term (current) drug therapy: Secondary | ICD-10-CM | POA: Diagnosis not present

## 2017-10-20 DIAGNOSIS — R300 Dysuria: Secondary | ICD-10-CM | POA: Diagnosis present

## 2017-10-20 LAB — PREGNANCY, URINE: Preg Test, Ur: NEGATIVE

## 2017-10-20 LAB — URINALYSIS, ROUTINE W REFLEX MICROSCOPIC
Bilirubin Urine: NEGATIVE
Glucose, UA: 50 mg/dL — AB
Ketones, ur: NEGATIVE mg/dL
Leukocytes, UA: NEGATIVE
Nitrite: POSITIVE — AB
Protein, ur: 30 mg/dL — AB
Specific Gravity, Urine: 1.014 (ref 1.005–1.030)
pH: 5 (ref 5.0–8.0)

## 2017-10-20 MED ORDER — SULFAMETHOXAZOLE-TRIMETHOPRIM 800-160 MG PO TABS: 1.0000 | ORAL_TABLET | Freq: Two times a day (BID) | ORAL | 0 refills | Status: AC

## 2017-10-20 MED ORDER — SULFAMETHOXAZOLE-TRIMETHOPRIM 800-160 MG PO TABS
1.0000 | ORAL_TABLET | Freq: Once | ORAL | Status: AC
  Administered 2017-10-20: 1 via ORAL
  Filled 2017-10-20: qty 1

## 2017-10-20 MED ORDER — IBUPROFEN 400 MG PO TABS
600.0000 mg | ORAL_TABLET | Freq: Once | ORAL | Status: AC
  Administered 2017-10-20: 600 mg via ORAL
  Filled 2017-10-20: qty 2

## 2017-10-20 MED ORDER — LORAZEPAM 1 MG PO TABS
1.0000 mg | ORAL_TABLET | Freq: Once | ORAL | Status: AC
  Administered 2017-10-20: 1 mg via ORAL
  Filled 2017-10-20: qty 1

## 2017-10-20 NOTE — ED Triage Notes (Addendum)
Pt c/o hard to urinate. Pain to suprapubic area at present. States feels better when she does urinate. Denies back or flank pain, denies n/v/d. Pt reports not having a period in 2 months. Pt uses cocaine and marijuana

## 2017-10-20 NOTE — ED Notes (Signed)
Patient sleeping upon entrance.Vistior left the room and the patient requested that visitor not be told of her pregnancy test request.

## 2017-10-20 NOTE — ED Provider Notes (Signed)
[facility] EMERGENCY DEPARTMENT Provider Note   CSN: [id number] Arrival date & time: 10/20/17  1458     History   Chief Complaint Chief Complaint  Patient presents with  . Dysuria    HPI Martha Cochran is a 37 y.o. female.  HPI   37 year old female with dysuria.  Progressively worsening over the past 2 weeks.  She is with suprapubic pressure.  No fevers or chills.  No nausea or vomiting.  No unusual vaginal bleeding or discharge.  Denies any back pain.  No change in bowel movements.  Past Medical History:  Diagnosis Date  . Bipolar 1 disorder (HCC)   . Bipolar disorder (HCC)   . Carpal tunnel syndrome   . Depression   . Ovarian cyst   . Ovarian cyst rupture     Patient Active Problem List   Diagnosis Date Noted  . Bipolar disorder (HCC) 05/15/2012  . Depression 05/15/2012  . Chronic pain 05/15/2012  . CTS (carpal tunnel syndrome) bilateral 05/15/2012    History reviewed. No pertinent surgical history.   OB History    Gravida  4   Para  3   Term  2   Preterm      AB      Living  3     SAB      TAB      Ectopic      Multiple      Live Births  3            Home Medications    Prior to Admission medications   Medication Sig Start Date End Date Taking? Authorizing Provider  amphetamine-dextroamphetamine (ADDERALL) 20 MG tablet Take 20 mg by mouth 2 (two) times daily.      [provider]  buprenorphine-naloxone (SUBOXONE) 8-2 mg SUBL SL tablet Place 1 tablet under the tongue daily.    [provider]  clonazePAM (KLONOPIN) 0.5 MG tablet Take 0.5 mg by mouth 2 (two) times daily as needed for anxiety.    [provider]  clotrimazole (LOTRIMIN) 1 % cream Apply to affected area 2 times daily 06/11/17   Benjiman Core, MD  diazepam (VALIUM) 10 MG tablet Take 10 mg by mouth every 6 (six) hours as needed. Anxiety     [provider]  sulfamethoxazole-trimethoprim (BACTRIM DS,SEPTRA DS) 800-160 MG tablet  Take 1 tablet by mouth 2 (two) times daily. 06/11/17   Benjiman Core, MD    Family History Family History  Problem Relation Age of Onset  . Diabetes Mother     Social History Social History   Tobacco Use  . Smoking status: Current Every Day Smoker    Packs/day: 0.50    Types: Cigarettes, E-cigarettes  . Smokeless tobacco: Never Used  Substance Use Topics  . Alcohol use: Not Currently    Comment: occ  . Drug use: Not Currently    Types: Benzodiazepines, Amphetamines, Marijuana, Cocaine    Comment: last used cocaine x2 days ago/marijuana 3 wks ago.      Allergies   Penicillins; Tramadol; and Amoxicillin   Review of Systems Review of Systems  All systems reviewed and negative, other than as noted in HPI.  Physical Exam Updated Vital Signs BP (!) 133/98 (BP Location: Right Arm)   Pulse 100   Temp 98.2 F (36.8 C) (Oral)   Resp 19   LMP 08/20/2017   SpO2 100%   Physical Exam  Constitutional: She appears well-developed and well-nourished. No distress.  HENT:  Head: Normocephalic and atraumatic.  Eyes: Conjunctivae are normal. Right eye exhibits no discharge. Left eye exhibits no discharge.  Neck: Neck supple.  Cardiovascular: Normal rate, regular rhythm and normal heart sounds. Exam reveals no gallop and no friction rub.  No murmur heard. Pulmonary/Chest: Effort normal and breath sounds normal. No respiratory distress.  Abdominal: Soft. She exhibits no distension. There is no tenderness.  Mild suprapubic tenderness without rebound or guarding.  No distention.  No CVA tenderness.  Musculoskeletal: She exhibits no edema or tenderness.  Neurological: She is alert.  Skin: Skin is warm and dry.  Psychiatric: She has a normal mood and affect. Her behavior is normal. Thought content normal.  Nursing note and vitals reviewed.    ED Treatments / Results  Labs (all labs ordered are listed, but only abnormal results are displayed) Labs Reviewed  URINE CULTURE -  Abnormal; Notable for the following components:      Result Value   Culture >=100,000 COLONIES/mL ESCHERICHIA COLI (*)    Organism ID, Bacteria ESCHERICHIA COLI (*)    All other components within normal limits  URINALYSIS, ROUTINE W REFLEX MICROSCOPIC - Abnormal; Notable for the following components:   Color, Urine AMBER (*)    APPearance HAZY (*)    Glucose, UA 50 (*)    Hgb urine dipstick SMALL (*)    Protein, ur 30 (*)    Nitrite POSITIVE (*)    All other components within normal limits  PREGNANCY, URINE    EKG None  Radiology No results found.  Procedures Procedures (including critical care time)  Medications Ordered in ED Medications  LORazepam (ATIVAN) tablet 1 mg (1 mg Oral Given 10/20/17 1525)  ibuprofen (ADVIL,MOTRIN) tablet 600 mg (600 mg Oral Given 10/20/17 1526)     Initial Impression / Assessment and Plan / ED Course  I have reviewed the triage vital signs and the nursing notes.  Pertinent labs & imaging results that were available during my care of the patient were reviewed by me and considered in my medical decision making (see chart for details).     I have reviewed the triage vital signs and the nursing notes. Prior records were reviewed for additional information.    Pertinent labs & imaging results that were available during my care of the patient were reviewed by me and considered in my medical decision making (see chart for details).  37 year old female with dysuria and suprapubic TI.  She is afebrile.  Reassuring abdominal exam.  Tolerating p.o.  She is appropriate for outpatient treatment.  Urine culture sent.  Penicillin allergy.  She was started on Bactrim.  Final Clinical Impressions(s) / ED Diagnoses   Final diagnoses:  Cystitis    ED Discharge Orders    None       Raeford RazorKohut, Leeandra Ellerson, MD 10/28/17 2135

## 2017-10-22 LAB — URINE CULTURE: Culture: >=100000 — AB

## 2017-10-23 ENCOUNTER — Telehealth: Payer: Self-pay

## 2017-10-23 NOTE — Telephone Encounter (Signed)
Post ED Visit - Positive Culture Follow-up  Culture report reviewed by antimicrobial stewardship pharmacist:  []  Enzo Bi, Pharm.D. []  Celedonio Miyamoto, [address].D., BCPS AQ-ID []  Garvin Fila, Pharm.D., BCPS []  Georgina Pillion, Pharm.D., BCPS []  [place], [address].D., BCPS, AAHIVP []  Estella Husk, Pharm.D., BCPS, AAHIVP [x]  Lysle Pearl, PharmD, BCPS []  Phillips Climes, PharmD, BCPS []  Agapito Games, PharmD, BCPS []  Verlan Friends, PharmD  Positive urine culture Treated with Bactrim DS, organism sensitive to the same and no further patient follow-up is required at this time.  Jerry Caras 10/23/2017, 9:19 AM

## 2018-01-31 ENCOUNTER — Encounter (HOSPITAL_COMMUNITY): Payer: Self-pay | Admitting: *Deleted

## 2018-01-31 ENCOUNTER — Other Ambulatory Visit: Payer: Self-pay

## 2018-01-31 ENCOUNTER — Emergency Department (HOSPITAL_COMMUNITY)
Admission: EM | Admit: 2018-01-31 | Discharge: 2018-01-31 | Disposition: A | Discharge status: Home / Self Care | Payer: Medicaid Other | Attending: Emergency Medicine | Admitting: Emergency Medicine

## 2018-01-31 ENCOUNTER — Emergency Department (HOSPITAL_COMMUNITY): Payer: Medicaid Other

## 2018-01-31 DIAGNOSIS — Z79899 Other long term (current) drug therapy: Secondary | ICD-10-CM | POA: Insufficient documentation

## 2018-01-31 DIAGNOSIS — R0789 Other chest pain: Secondary | ICD-10-CM | POA: Insufficient documentation

## 2018-01-31 DIAGNOSIS — N3000 Acute cystitis without hematuria: Secondary | ICD-10-CM | POA: Insufficient documentation

## 2018-01-31 DIAGNOSIS — F319 Bipolar disorder, unspecified: Secondary | ICD-10-CM | POA: Insufficient documentation

## 2018-01-31 DIAGNOSIS — F17210 Nicotine dependence, cigarettes, uncomplicated: Secondary | ICD-10-CM | POA: Diagnosis not present

## 2018-01-31 DIAGNOSIS — F1910 Other psychoactive substance abuse, uncomplicated: Secondary | ICD-10-CM | POA: Diagnosis not present

## 2018-01-31 DIAGNOSIS — R079 Chest pain, unspecified: Secondary | ICD-10-CM | POA: Diagnosis present

## 2018-01-31 LAB — COMPREHENSIVE METABOLIC PANEL
ALT: 13 U/L (ref 0–44)
ANION GAP: 7 (ref 5–15)
AST: 18 U/L (ref 15–41)
Albumin: 3.6 g/dL (ref 3.5–5.0)
Alkaline Phosphatase: 60 U/L (ref 38–126)
BILIRUBIN TOTAL: 0.4 mg/dL (ref 0.3–1.2)
BUN: 10 mg/dL (ref 6–20)
CALCIUM: 8.8 mg/dL — AB (ref 8.9–10.3)
CO2: 23 mmol/L (ref 22–32)
Chloride: 107 mmol/L (ref 98–111)
Creatinine, Ser: 0.77 mg/dL (ref 0.44–1.00)
GFR calc non Af Amer: >60 mL/min (ref >60)
Glucose, Bld: 109 mg/dL — ABNORMAL HIGH (ref 70–99)
Potassium: 3.1 mmol/L — ABNORMAL LOW (ref 3.5–5.1)
SODIUM: 137 mmol/L (ref 135–145)
TOTAL PROTEIN: 7 g/dL (ref 6.5–8.1)

## 2018-01-31 LAB — URINALYSIS, ROUTINE W REFLEX MICROSCOPIC
Bilirubin Urine: NEGATIVE
Glucose, UA: NEGATIVE mg/dL
LEUKOCYTES UA: NEGATIVE
NITRITE: POSITIVE — AB
PROTEIN: 30 mg/dL — AB
Specific Gravity, Urine: >1.03 — ABNORMAL HIGH (ref 1.005–1.030)
pH: 6 (ref 5.0–8.0)

## 2018-01-31 LAB — RAPID URINE DRUG SCREEN, HOSP PERFORMED
Amphetamines: POSITIVE — AB
Barbiturates: NOT DETECTED
Benzodiazepines: POSITIVE — AB
Cocaine: POSITIVE — AB
Opiates: POSITIVE — AB
Tetrahydrocannabinol: NOT DETECTED

## 2018-01-31 LAB — CBC WITH DIFFERENTIAL/PLATELET
ABS IMMATURE GRANULOCYTES: 0.04 10*3/uL (ref 0.00–0.07)
BASOS PCT: 0 %
Basophils Absolute: 0.1 10*3/uL (ref 0.0–0.1)
EOS ABS: 0.2 10*3/uL (ref 0.0–0.5)
Eosinophils Relative: 1 %
HCT: 39.1 % (ref 36.0–46.0)
Hemoglobin: 12.8 g/dL (ref 12.0–15.0)
IMMATURE GRANULOCYTES: 0 %
Lymphocytes Relative: 23 %
Lymphs Abs: 3.1 10*3/uL (ref 0.7–4.0)
MCH: 31.1 pg (ref 26.0–34.0)
MCHC: 32.7 g/dL (ref 30.0–36.0)
MCV: 94.9 fL (ref 80.0–100.0)
MONO ABS: 0.7 10*3/uL (ref 0.1–1.0)
Monocytes Relative: 5 %
NEUTROS PCT: 71 %
Neutro Abs: 9.5 10*3/uL — ABNORMAL HIGH (ref 1.7–7.7)
PLATELETS: 310 10*3/uL (ref 150–400)
RBC: 4.12 MIL/uL (ref 3.87–5.11)
RDW: 11.9 % (ref 11.5–15.5)
WBC: 13.6 10*3/uL — AB (ref 4.0–10.5)
nRBC: 0 % (ref 0.0–0.2)

## 2018-01-31 LAB — URINALYSIS, MICROSCOPIC (REFLEX)

## 2018-01-31 LAB — TROPONIN I: Troponin I: <0.03 ng/mL (ref <0.03)

## 2018-01-31 LAB — D-DIMER, QUANTITATIVE (NOT AT ARMC)

## 2018-01-31 LAB — PREGNANCY, URINE: PREG TEST UR: NEGATIVE

## 2018-01-31 MED ORDER — NITROFURANTOIN MONOHYD MACRO 100 MG PO CAPS: 100.0000 mg | ORAL_CAPSULE | Freq: Two times a day (BID) | ORAL | 0 refills | Status: DC

## 2018-01-31 NOTE — ED Triage Notes (Signed)
Pt has multiple complaints x one week; pt c/o chest pain and left arm numbness; and pt is c/o having a bacterial infection and UTI

## 2018-01-31 NOTE — Discharge Instructions (Addendum)
You were seen today for multiple complaints.  Your work-up is largely reassuring.  You do have evidence of urinary tract infection.  We started on an antibiotic.  Take Motrin as needed for any pain.

## 2018-01-31 NOTE — ED Provider Notes (Addendum)
[facility] EMERGENCY DEPARTMENT Provider Note   CSN: [id number] Arrival date & time: 01/31/18  0012     History   Chief Complaint Chief Complaint  Patient presents with  . Chest Pain    HPI Martha Cochran is a 37 y.o. female.  HPI  This is a 37 year old female who presents with multiple complaints.  History of bipolar disorder, carpal tunnel syndrome, polysubstance abuse who presents with initial complaint of chest pain.  Reports 2 to 3-day history of worsening sharp left-sided chest pain.  It comes and goes.  It appears to be associated with anxiety.  She denies any exertional symptoms or worsening of pain with breathing.  She does report worsening symptoms of her carpal tunnel left greater than right.  She reports numbness up to her mid arm which is worse after sleeping.  Additionally she reports she has "a spot on my back that swells and then goes down."  Furthermore she reports chronic UTI bacterial infections which she relates to her Mirena.  She states that she came today for her chest pain.  She is not had any fevers.  Denies abdominal pain.  Reports intermittent nausea and vomiting.  Patient does report history of alcohol use and cocaine abuse.  She is in a Suboxone clinic.    Past Medical History:  Diagnosis Date  . Bipolar 1 disorder (HCC)   . Bipolar disorder (HCC)   . Carpal tunnel syndrome   . Depression   . Ovarian cyst   . Ovarian cyst rupture     Patient Active Problem List   Diagnosis Date Noted  . Bipolar disorder (HCC) 05/15/2012  . Depression 05/15/2012  . Chronic pain 05/15/2012  . CTS (carpal tunnel syndrome) bilateral 05/15/2012    History reviewed. No pertinent surgical history.   OB History    Gravida  4   Para  3   Term  2   Preterm      AB      Living  3     SAB      TAB      Ectopic      Multiple      Live Births  3            Home Medications    Prior to Admission medications   Medication Sig Start Date  End Date Taking? Authorizing Provider  amphetamine-dextroamphetamine (ADDERALL) 20 MG tablet Take 20 mg by mouth 2 (two) times daily.      [provider]  Buprenorphine HCl-Naloxone HCl 8-2 MG FILM Place 1 tablet under the tongue 2 (two) times daily.     [provider]  clonazePAM (KLONOPIN) 0.5 MG tablet Take 0.5 mg by mouth 2 (two) times daily as needed for anxiety.    [provider]  nitrofurantoin, macrocrystal-monohydrate, (MACROBID) 100 MG capsule Take 1 capsule (100 mg total) by mouth 2 (two) times daily. 01/31/18   Horton, Mayer Maskerourtney F, MD    Family History Family History  Problem Relation Age of Onset  . Diabetes Mother     Social History Social History   Tobacco Use  . Smoking status: Current Every Day Smoker    Packs/day: 0.50    Types: Cigarettes, E-cigarettes  . Smokeless tobacco: Never Used  Substance Use Topics  . Alcohol use: Yes    Comment: occ  . Drug use: Yes    Types: Benzodiazepines, Amphetamines, Marijuana, Cocaine    Comment: last used cocaine x2 days ago/marijuana 3 wks  ago.      Allergies   Penicillins; Tramadol; and Amoxicillin   Review of Systems Review of Systems  Constitutional: Negative for fever.  HENT: Negative for congestion.   Respiratory: Negative for cough and shortness of breath.   Cardiovascular: Positive for chest pain. Negative for leg swelling.  Gastrointestinal: Positive for nausea and vomiting. Negative for abdominal pain.  Genitourinary: Positive for dysuria and vaginal discharge.  Musculoskeletal: Negative for back pain.  Skin: Negative for rash and wound.  Neurological: Negative for headaches.  All other systems reviewed and are negative.    Physical Exam Updated Vital Signs BP (!) 123/99   Pulse 89   Temp 98.1 F (36.7 C) (Oral)   Resp (!) 22   Ht 1.676 m (5\' 6" )   Wt 65.8 kg   LMP 01/29/2018   SpO2 97%   BMI 23.40 kg/m   Physical Exam  Constitutional: She is oriented to person,  place, and time.  Disheveled appearing, pressured speech, nontoxic  HENT:  Head: Normocephalic and atraumatic.  Eyes: Pupils are equal, round, and reactive to light.  Neck: Neck supple.  Cardiovascular: Normal rate, normal heart sounds and normal pulses.  No murmur heard. Tachycardia  Pulmonary/Chest: Effort normal. No respiratory distress. She has no wheezes.  Abdominal: Soft. Bowel sounds are normal. There is no tenderness.  Musculoskeletal:       Right lower leg: She exhibits no edema.       Left lower leg: She exhibits no edema.  Neurological: She is alert and oriented to person, place, and time.  Cranial nerves II through XII intact, 5 out of 5 strength in all 4 extremities  Skin: Skin is warm and dry.  Psychiatric: She has a normal mood and affect.  Pressured speech, appears manic  Nursing note and vitals reviewed.    ED Treatments / Results  Labs (all labs ordered are listed, but only abnormal results are displayed) Labs Reviewed  CBC WITH DIFFERENTIAL/PLATELET - Abnormal; Notable for the following components:      Result Value   WBC 13.6 (*)    Neutro Abs 9.5 (*)    All other components within normal limits  COMPREHENSIVE METABOLIC PANEL - Abnormal; Notable for the following components:   Potassium 3.1 (*)    Glucose, Bld 109 (*)    Calcium 8.8 (*)    All other components within normal limits  URINALYSIS, ROUTINE W REFLEX MICROSCOPIC - Abnormal; Notable for the following components:   Color, Urine AMBER (*)    APPearance CLOUDY (*)    Specific Gravity, Urine >1.030 (*)    Hgb urine dipstick SMALL (*)    Ketones, ur TRACE (*)    Protein, ur 30 (*)    Nitrite POSITIVE (*)    All other components within normal limits  RAPID URINE DRUG SCREEN, HOSP PERFORMED - Abnormal; Notable for the following components:   Opiates POSITIVE (*)    Cocaine POSITIVE (*)    Benzodiazepines POSITIVE (*)    Amphetamines POSITIVE (*)    All other components within normal limits    URINALYSIS, MICROSCOPIC (REFLEX) - Abnormal; Notable for the following components:   Bacteria, UA MANY (*)    All other components within normal limits  URINE CULTURE  TROPONIN I  D-DIMER, QUANTITATIVE (NOT AT [facility])  PREGNANCY, URINE    EKG EKG Interpretation  Date/Time:  Tuesday January 31 2018 00:24:21 EST Ventricular Rate:  100 PR Interval:    QRS Duration: 72 QT Interval:  351 QTC Calculation: 453 R Axis:   78 Text Interpretation:  Sinus tachycardia Confirmed by Ross Marcus ([id number]) on 01/31/2018 1:22:33 AM   Radiology Dg Chest 2 View  Result Date: 01/31/2018 CLINICAL DATA:  37 year old female with chest pain. EXAM: CHEST - 2 VIEW COMPARISON:  Chest radiograph dated 11/14/2015 FINDINGS: The heart size and mediastinal contours are within normal limits. Both lungs are clear. The visualized skeletal structures are unremarkable. IMPRESSION: No active cardiopulmonary disease. Electronically Signed   By: Elgie Collard M.D.   On: 01/31/2018 01:16    Procedures Procedures (including critical care time)  Medications Ordered in ED Medications - No data to display   Initial Impression / Assessment and Plan / ED Course  I have reviewed the triage vital signs and the nursing notes.  Pertinent labs & imaging results that were available during my care of the patient were reviewed by me and considered in my medical decision making (see chart for details).     Patient presents with multiple complaints.  She appears hypomanic on exam.  Her vital signs are largely reassuring.  She has history of polysubstance abuse.  She came today primarily for her chest pain but also reports multiple other complaints.  Lab work-up is largely reassuring.  She has a leukocytosis but otherwise her metabolic panel is reassuring.  She does have evidence of UTI.  Urine was cultured.  EKG shows no evidence of ischemia or arrhythmia.  Chest x-ray shows no evidence of pneumothorax or pneumonia.   D-dimer is negative.  She is afebrile and without any murmur.  Doubt endocarditis.  Will treat UTI with Macrobid given allergies and based on review of prior test results.  Follow-up with primary as needed.  After history, exam, and medical workup I feel the patient has been appropriately medically screened and is safe for discharge home. Pertinent diagnoses were discussed with the patient. Patient was given return precautions.   Final Clinical Impressions(s) / ED Diagnoses   Final diagnoses:  Atypical chest pain  Acute cystitis without hematuria  Polysubstance abuse [facility])    ED Discharge Orders         Ordered    nitrofurantoin, macrocrystal-monohydrate, (MACROBID) 100 MG capsule  2 times daily     01/31/18 0225           Horton, Mayer Masker, MD 01/31/18 1308    Shon Baton, MD 01/31/18 [phone]

## 2018-02-02 LAB — URINE CULTURE: Culture: >=100000 — AB

## 2018-02-03 ENCOUNTER — Telehealth: Payer: Self-pay

## 2018-02-03 NOTE — Progress Notes (Signed)
ED Antimicrobial Stewardship Positive Culture Follow Up   Martha Cochran is an 37 y.o. female who presented to [facility] on [facility] with a chief complaint of  Chief Complaint  Patient presents with  . Chest Pain    Recent Results (from the past 720 hour(s))  Urine culture     Status: Abnormal   Collection Time: 01/31/18 12:41 AM  Result Value Ref Range Status   Specimen Description   Final    URINE, CLEAN CATCH Performed at [facility], [address] [place], [place] [address]    Special Requests   Final    NONE Performed at [facility], [address] [place], [place] [address]    Culture (A)  Final    >=100,000 COLONIES/mL KLEBSIELLA PNEUMONIAE >=100,000 COLONIES/mL ESCHERICHIA COLI    Report Status 02/02/2018 FINAL  Final   Organism ID, Bacteria KLEBSIELLA PNEUMONIAE (A)  Final   Organism ID, Bacteria ESCHERICHIA COLI (A)  Final      Susceptibility   Escherichia coli - MIC*    AMPICILLIN <=2 SENSITIVE Sensitive     CEFAZOLIN <=4 SENSITIVE Sensitive     CEFTRIAXONE <=1 SENSITIVE Sensitive     CIPROFLOXACIN >=4 RESISTANT Resistant     GENTAMICIN <=1 SENSITIVE Sensitive     IMIPENEM <=0.25 SENSITIVE Sensitive     NITROFURANTOIN <=16 SENSITIVE Sensitive     TRIMETH/SULFA <=20 SENSITIVE Sensitive     AMPICILLIN/SULBACTAM <=2 SENSITIVE Sensitive     PIP/TAZO <=4 SENSITIVE Sensitive     Extended ESBL NEGATIVE Sensitive     * >=100,000 COLONIES/mL ESCHERICHIA COLI   Klebsiella pneumoniae - MIC*    AMPICILLIN >=32 RESISTANT Resistant     CEFAZOLIN <=4 SENSITIVE Sensitive     CEFTRIAXONE <=1 SENSITIVE Sensitive     CIPROFLOXACIN <=0.25 SENSITIVE Sensitive     GENTAMICIN <=1 SENSITIVE Sensitive     IMIPENEM <=0.25 SENSITIVE Sensitive     NITROFURANTOIN 64 INTERMEDIATE Intermediate     TRIMETH/SULFA <=20 SENSITIVE Sensitive     AMPICILLIN/SULBACTAM 4 SENSITIVE Sensitive     PIP/TAZO <=4 SENSITIVE Sensitive     Extended ESBL NEGATIVE Sensitive     * >=100,000  COLONIES/mL KLEBSIELLA PNEUMONIAE    [x]  Treated with macrobid, organism resistant to prescribed antimicrobial []  Patient discharged originally without antimicrobial agent and treatment is now indicated  New antibiotic prescription: If pt with continued symptoms, DC macrobid and start bactrim DS 1 tablet PO BID x 3 days  ED Provider: Leary RocaMichael Maczis, PA   Martha Cochran, Martha Cochran 02/03/2018, 8:36 AM Clinical Pharmacist Monday - Friday phone -  [phone] Saturday - Sunday phone - [phone]

## 2018-02-03 NOTE — Telephone Encounter (Signed)
Post ED Visit - Positive Culture Follow-up: Unsuccessful Patient Follow-up  Culture assessed and recommendations reviewed by:  []  Enzo BiNathan Batchelder, Pharm.D. []  Celedonio MiyamotoJeremy Frens, Pharm.D., BCPS AQ-ID []  Garvin FilaMike Maccia, Pharm.D., BCPS []  Georgina PillionElizabeth Martin, Pharm.D., BCPS []  LawrencevilleMinh Pham, [address]Pharm.D., BCPS, AAHIVP []  Estella HuskMichelle Turner, Pharm.D., BCPS, AAHIVP []  Sherlynn CarbonAustin Lucas, PharmD []  Pollyann SamplesAndy Johnston, PharmD, BCPS Fleet Contrasachel Rumbarger Pharm D Positive urine culture Symptom check  May need different abx []  Patient discharged without antimicrobial prescription and treatment is now indicated []  Organism is resistant to prescribed ED discharge antimicrobial []  Patient with positive blood cultures   Unable to contact patient after 3 attempts, letter will be sent to address on file  Jerry CarasCullom, Andree Golphin Burnett 02/03/2018, 8:56 AM

## 2018-02-09 ENCOUNTER — Telehealth: Payer: Self-pay | Admitting: *Deleted

## 2018-02-09 NOTE — Telephone Encounter (Signed)
Contacted by patient in response to letter sent to address on file regarding (+) urine culture.  States she continues to have symptoms of UTI.  Bactrim DS 1 tab PO BID x 3 days called to [facility], [place] [phone].  Patient also requesting prescription for Xanax and note for work. Explained to patient I am unable to accomodate additional requests that do not relate to her (+) urine culture.

## 2019-01-12 ENCOUNTER — Emergency Department (HOSPITAL_COMMUNITY)
Admission: EM | Admit: 2019-01-12 | Discharge: 2019-01-12 | Disposition: A | Discharge status: Home / Self Care | Payer: Medicaid Other | Attending: Emergency Medicine | Admitting: Emergency Medicine

## 2019-01-12 ENCOUNTER — Encounter (HOSPITAL_COMMUNITY): Payer: Self-pay

## 2019-01-12 ENCOUNTER — Other Ambulatory Visit: Payer: Self-pay

## 2019-01-12 DIAGNOSIS — Z79899 Other long term (current) drug therapy: Secondary | ICD-10-CM | POA: Diagnosis not present

## 2019-01-12 DIAGNOSIS — R4182 Altered mental status, unspecified: Secondary | ICD-10-CM | POA: Diagnosis present

## 2019-01-12 DIAGNOSIS — F17210 Nicotine dependence, cigarettes, uncomplicated: Secondary | ICD-10-CM | POA: Insufficient documentation

## 2019-01-12 DIAGNOSIS — T50901A Poisoning by unspecified drugs, medicaments and biological substances, accidental (unintentional), initial encounter: Secondary | ICD-10-CM | POA: Diagnosis not present

## 2019-01-12 MED ORDER — NALOXONE HCL 4 MG/0.1ML NA LIQD
1.0000 | Freq: Once | NASAL | Status: AC
  Administered 2019-01-12: 1 via NASAL
  Filled 2019-01-12: qty 4

## 2019-01-12 MED ORDER — NALOXONE HCL 4 MG/0.1ML NA LIQD: NASAL | 1 refills | Status: AC
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
## 2019-01-12 NOTE — Discharge Instructions (Signed)
Substance Abuse Treatment Programs ° °Intensive Outpatient Programs °[facility]     °[address]      °[place], [place]                   °[phone]      ° °[facility] °[address] °[place], [place] °[phone] ° °[facility]     °(Inpatient and outpatient)     °[address]           °[phone]   ° °[facility] °336-288-1484 (Suboxone and Methadone) ° °[address]      °[place], [place] [address]      °336-882-2125      ° °[address] °[place], [place] °852-3033 ° °[facility] (Outpatient/Inpatient, Chemical)    °(insurance only) [phone]      °       °[facility] (Groups & Residential) °[place], [place] °336-389-1413 ° °   °[facility]     °[address]     °[place], [place]      °[phone]      ° °[facility] (for caregivers and family) °[address] °[place], [place] °336-299-4655 ° ° ° ° ° °Residential Treatment Programs °[facility]      °[address], [place], [place] [address]  °[phone]      ° °[facility] °[address] [place], [place] [address] °[phone] ° °[facility]        °336-248-8914      ° °[facility] °1-800-659-3381 ° °[facility]             °[address]                                         °[place], [place]                                                °877-615-2722 or 336-784-9470                              ° °[facility] [place] °[address] °[place], [address] °[phone] ° °[facility]    °[address]      °[place], [place]     °[phone]      ° °[facility] °[address] °[place], [place] °336-285-9073 ° °[facility]   °[address]     °[place], [place] [address]     °[phone]      °Admissions: 8am-3pm M-F ° °[facility] °[address] °[place] °336-227-7417 ° °[facility]: Residential Program (90 Days)   °[place], [place]      °336-725-8389 or 800-758-6077    ° °[facility] °[place], [place] °(Walk in Hours over the weekend or by referral) ° °[facility] °[address], [place], [place] [address] °([phone] ° °Crisis Mobile: [facility]:  1-877-626-1772 (for crisis response 24 hours a day) °[facility] Hotline:      1-800-256-2452 °Outpatient Psychiatry and Counseling ° °[facility]: Mobile Crisis   Management 24 hours:  1-877-626-1772 ° °[facility] sliding scale fee and walk in schedule: M-F 8am-12pm/1pm-3pm °[address]  °[place], [place] [address] °[phone] ° °[facility] °[address] °[place], [place] [address] °336-703-9650 ° °[facility] ([facility])- new patient walk-in appointments available Monday - Friday 8am -3pm.          °[address] °[place], [place] [address] °336-676-6840 or crisis line- 336-676-6905 ° °[facility] Intensive Outpatient Therapy Program °[address] °[place], [place] [address] °336-832-9804 ° °[facility]                  °Crisis Services      °336.641.4993      °[address]     °[place], [place] [address]                ° °[facility]   °[facility] °[phone] °[address] °[place], [place] [address] ° ° °[facility]          °[address]  °[place], [place] [address]       °([phone] ° °[facility] °[address] °[place], [place] [address] °336-292-1510 ° °[facility]    °[address]    °[place], [place] [address]     °336-632-3505      ° °[facility] [place], [place]     °[address]     °[place] [address]     °336-282-1251     °  °[facility] °[address] °[place] [address] ° °[facility]     °[address]     °[place], [place]      °336-542-2076      ° °[facility] °Shamsher Ahluwalia, MD °[address] °[place], [place] [address] °336-420-9558 ° °[facility]     °[address]     °[place], [place] [address]     °336-274-1237      ° °[facility] °[address] °[place], [place] [address] °336-854-4450 °Resources for Temporary Residential Assistance/Crisis Centers ° °DAY CENTERS °[facility] °M-F 8am-3pm   °[address] [place], [place] [address]   336-332-0824 °Services include: laundry, barbering, support groups, case management, phone  & computer access, showers, AA/NA mtgs, mental health/substance abuse nurse, job skills class, disability information, VA assistance, spiritual classes, etc.  ° °HOMELESS SHELTERS ° °[facility]     °[facility]   °[address], [place]     °336.271.5959       °       °[facility] (women and children)       °[address] °[place], [place] [address] °336-275-0820 °[email or web address] for application and process °Application Required ° °[facility]   °[address]    °[place] [address]     °336.886.4922       °             °[facility] °[address] °[place], [place] [address] °336.273.5572 °336-235-0363(schedule application appt.) °Application Required ° °[facility] (women only)    °[address]     °[place], [place] [address]     °336-884-1039      °  Intake starts 6pm daily °Need valid ID, SSC, & Police report °[facility] [place] °[address] °[place], [place] °336-881-5420 °Application Required ° °[facility] (men only)     °[address]      °[place]     °[phone]      ° °[facility] °(Pregnant women only) °[address] °[place], [place] °336-275-0206 ° °[facility]      °[address]      °[place] [address]     °336-722-9951      °       °[facility] °[address] °[place], [place] °336-723-1848 °90 day commitment/SA/Application process ° °[facility](men only)     °[address]     °[place], [place]     °336-748-1962       °Check-in at 7pm     °       °[facility] °[address] °[place], [place] [address] °336-248-6684 °Men/Women/Women and Children must be there by 7 pm ° °[facility] °[place], [place] °336-722-8721                ° °
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
## 2020-01-08 ENCOUNTER — Other Ambulatory Visit: Payer: Medicaid Other

## 2020-04-30 ENCOUNTER — Other Ambulatory Visit: Payer: Self-pay

## 2020-04-30 ENCOUNTER — Emergency Department (HOSPITAL_COMMUNITY)
Admission: EM | Admit: 2020-04-30 | Discharge: 2020-04-30 | Disposition: A | Discharge status: Home / Self Care | Payer: Medicaid Other | Attending: Emergency Medicine | Admitting: Emergency Medicine

## 2020-04-30 ENCOUNTER — Emergency Department (HOSPITAL_COMMUNITY): Payer: Medicaid Other

## 2020-04-30 ENCOUNTER — Encounter (HOSPITAL_COMMUNITY): Payer: Self-pay | Admitting: Emergency Medicine

## 2020-04-30 DIAGNOSIS — S59911A Unspecified injury of right forearm, initial encounter: Secondary | ICD-10-CM | POA: Diagnosis present

## 2020-04-30 DIAGNOSIS — M25561 Pain in right knee: Secondary | ICD-10-CM | POA: Insufficient documentation

## 2020-04-30 DIAGNOSIS — S52501A Unspecified fracture of the lower end of right radius, initial encounter for closed fracture: Secondary | ICD-10-CM | POA: Diagnosis not present

## 2020-04-30 DIAGNOSIS — M542 Cervicalgia: Secondary | ICD-10-CM | POA: Insufficient documentation

## 2020-04-30 DIAGNOSIS — M545 Low back pain, unspecified: Secondary | ICD-10-CM | POA: Insufficient documentation

## 2020-04-30 DIAGNOSIS — S52614A Nondisplaced fracture of right ulna styloid process, initial encounter for closed fracture: Secondary | ICD-10-CM | POA: Diagnosis not present

## 2020-04-30 DIAGNOSIS — F17210 Nicotine dependence, cigarettes, uncomplicated: Secondary | ICD-10-CM | POA: Diagnosis not present

## 2020-04-30 HISTORY — DX: Schizophrenia, unspecified: F20.9

## 2020-04-30 HISTORY — DX: Panic disorder (episodic paroxysmal anxiety): F41.0

## 2020-04-30 HISTORY — DX: Post-traumatic stress disorder, unspecified: F43.10

## 2020-04-30 MED ORDER — OXYCODONE-ACETAMINOPHEN 5-325 MG PO TABS: 1.0000 | ORAL_TABLET | Freq: Four times a day (QID) | ORAL | 0 refills | Status: DC | PRN

## 2020-04-30 MED ORDER — HYDROCODONE-ACETAMINOPHEN 5-325 MG PO TABS
2.0000 | ORAL_TABLET | Freq: Once | ORAL | Status: AC
  Administered 2020-04-30: 2 via ORAL
  Filled 2020-04-30: qty 2

## 2020-04-30 MED ORDER — IBUPROFEN 800 MG PO TABS: 800.0000 mg | ORAL_TABLET | Freq: Three times a day (TID) | ORAL | 0 refills | Status: AC

## 2020-04-30 NOTE — ED Notes (Addendum)
Pt in bed, splint checked by provider, pt can move all digits on R hand, reports positive sensation to touch, less than three sec cap refill, reviewed splint care, pt states that she is ready to go home, to go pack of percocet given with instructions, pt ambulatory from dpt.
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
## 2020-04-30 NOTE — ED Notes (Signed)
Rings removed from R hand and given to pt, two rings remain in pt's posession
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
## 2020-04-30 NOTE — ED Notes (Signed)
Pt back from ct and x ray, pt states that the vicodin didn't help with her pain and her pain is still a 10/10, pt states that she needs a shot of dilaudid.

## 2020-04-30 NOTE — Discharge Instructions (Signed)
You have fractures of your right distal radius and ulnar styloid.  Please remain in splint and keep this clean and dry until you follow-up with Dr. Dallas Schimke with orthopedics.  Ice and elevate the wrist.  You may take ibuprofen 600 mg every 6 hours, and may use oxycodone 1 tablet every 6 hours as needed for severe pain.  This medication can cause drowsiness and is a narcotic pain medication with addiction potential, please use with caution.

## 2020-04-30 NOTE — ED Notes (Signed)
Asked pt for urine sample for pregnancy test, pt states that she doesn't need to urinate at this time and isn't pregnant because she is currently on her period, PA and radiology notified.

## 2020-05-01 MED FILL — Oxycodone w/ Acetaminophen Tab 5-325 MG: ORAL | Qty: 6 | Status: AC

## 2020-06-09 ENCOUNTER — Telehealth: Payer: Self-pay | Admitting: Orthopaedic Surgery
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
## 2020-06-10 ENCOUNTER — Telehealth: Payer: Self-pay | Admitting: Orthopaedic Surgery
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
## 2020-06-12 NOTE — Telephone Encounter (Signed)
She had X-rays done 06-09-20 showing fracture is healing well, treated in [place]. I do not have her treating doctor's notes.  She can transfer care if she wants.  What is the reason?
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
## 2020-06-17 ENCOUNTER — Ambulatory Visit: Payer: Medicaid Other | Admitting: Orthopaedic Surgery

## 2020-06-24 ENCOUNTER — Ambulatory Visit: Payer: Medicaid Other | Admitting: Orthopaedic Surgery

## 2020-06-26 ENCOUNTER — Encounter: Payer: Self-pay | Admitting: Orthopaedic Surgery

## 2020-06-26 ENCOUNTER — Ambulatory Visit: Payer: Medicaid Other

## 2020-06-26 ENCOUNTER — Ambulatory Visit: Payer: Medicaid Other | Admitting: Orthopaedic Surgery

## 2020-06-26 ENCOUNTER — Telehealth: Payer: Self-pay | Admitting: Orthopaedic Surgery

## 2020-06-26 ENCOUNTER — Other Ambulatory Visit: Payer: Self-pay

## 2020-06-26 VITALS — BP 122/85 | HR 86 | Ht 65.0 in | Wt 155.0 lb

## 2020-06-26 DIAGNOSIS — S52501A Unspecified fracture of the lower end of right radius, initial encounter for closed fracture: Secondary | ICD-10-CM

## 2020-06-26 DIAGNOSIS — M25531 Pain in right wrist: Secondary | ICD-10-CM
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
## 2020-06-26 NOTE — Telephone Encounter (Signed)
Called patient to notify that the outside records and CD which she had brought in from [place] may be picked up. Aware notes copied to send to scanning center.

## 2020-07-15 ENCOUNTER — Ambulatory Visit: Payer: Medicaid Other | Admitting: Orthopaedic Surgery

## 2020-07-16 ENCOUNTER — Encounter: Payer: Self-pay | Admitting: Orthopaedic Surgery

## 2020-12-15 ENCOUNTER — Emergency Department (HOSPITAL_COMMUNITY)
Admission: EM | Admit: 2020-12-15 | Discharge: 2020-12-15 | Disposition: A | Discharge status: Home / Self Care | Payer: Medicaid Other | Attending: Emergency Medicine | Admitting: Emergency Medicine

## 2020-12-15 ENCOUNTER — Encounter (HOSPITAL_COMMUNITY): Payer: Self-pay | Admitting: *Deleted

## 2020-12-15 ENCOUNTER — Other Ambulatory Visit: Payer: Self-pay

## 2020-12-15 ENCOUNTER — Emergency Department (HOSPITAL_COMMUNITY): Payer: Medicaid Other

## 2020-12-15 DIAGNOSIS — Z20822 Contact with and (suspected) exposure to covid-19: Secondary | ICD-10-CM | POA: Diagnosis not present

## 2020-12-15 DIAGNOSIS — R5383 Other fatigue: Secondary | ICD-10-CM | POA: Insufficient documentation

## 2020-12-15 DIAGNOSIS — R4182 Altered mental status, unspecified: Secondary | ICD-10-CM | POA: Insufficient documentation

## 2020-12-15 DIAGNOSIS — Y900 Blood alcohol level of less than 20 mg/100 ml: Secondary | ICD-10-CM | POA: Insufficient documentation

## 2020-12-15 DIAGNOSIS — R000 Tachycardia, unspecified: Secondary | ICD-10-CM | POA: Insufficient documentation

## 2020-12-15 DIAGNOSIS — F17210 Nicotine dependence, cigarettes, uncomplicated: Secondary | ICD-10-CM | POA: Insufficient documentation

## 2020-12-15 DIAGNOSIS — R319 Hematuria, unspecified: Secondary | ICD-10-CM | POA: Diagnosis not present

## 2020-12-15 HISTORY — DX: Disorder of kidney and ureter, unspecified: N28.9

## 2020-12-15 LAB — RAPID URINE DRUG SCREEN, HOSP PERFORMED
Amphetamines: POSITIVE — AB
Barbiturates: NOT DETECTED
Benzodiazepines: NOT DETECTED
Cocaine: NOT DETECTED
Opiates: NOT DETECTED
Tetrahydrocannabinol: NOT DETECTED

## 2020-12-15 LAB — URINALYSIS, ROUTINE W REFLEX MICROSCOPIC
Bilirubin Urine: NEGATIVE
Glucose, UA: NEGATIVE mg/dL
Ketones, ur: NEGATIVE mg/dL
Leukocytes,Ua: NEGATIVE
Nitrite: NEGATIVE
Protein, ur: NEGATIVE mg/dL
Specific Gravity, Urine: 1 — ABNORMAL LOW (ref 1.005–1.030)
pH: 6 (ref 5.0–8.0)

## 2020-12-15 LAB — RESP PANEL BY RT-PCR (FLU A&B, COVID) ARPGX2
Influenza A by PCR: NEGATIVE
Influenza B by PCR: NEGATIVE
SARS Coronavirus 2 by RT PCR: NEGATIVE

## 2020-12-15 LAB — COMPREHENSIVE METABOLIC PANEL
ALT: 15 U/L (ref 0–44)
AST: 19 U/L (ref 15–41)
Albumin: 3.5 g/dL (ref 3.5–5.0)
Alkaline Phosphatase: 65 U/L (ref 38–126)
Anion gap: 7 (ref 5–15)
BUN: 13 mg/dL (ref 6–20)
CO2: 26 mmol/L (ref 22–32)
Calcium: 8.8 mg/dL — ABNORMAL LOW (ref 8.9–10.3)
Chloride: 103 mmol/L (ref 98–111)
Creatinine, Ser: 0.67 mg/dL (ref 0.44–1.00)
GFR, Estimated: >60 mL/min (ref >60)
Glucose, Bld: 126 mg/dL — ABNORMAL HIGH (ref 70–99)
Potassium: 3.8 mmol/L (ref 3.5–5.1)
Sodium: 136 mmol/L (ref 135–145)
Total Bilirubin: 0.4 mg/dL (ref 0.3–1.2)
Total Protein: 6.7 g/dL (ref 6.5–8.1)

## 2020-12-15 LAB — CBC WITH DIFFERENTIAL/PLATELET
Abs Immature Granulocytes: 0.02 10*3/uL (ref 0.00–0.07)
Basophils Absolute: 0 10*3/uL (ref 0.0–0.1)
Basophils Relative: 1 %
Eosinophils Absolute: 0.2 10*3/uL (ref 0.0–0.5)
Eosinophils Relative: 4 %
HCT: 39.2 % (ref 36.0–46.0)
Hemoglobin: 13 g/dL (ref 12.0–15.0)
Immature Granulocytes: 0 %
Lymphocytes Relative: 29 %
Lymphs Abs: 1.9 10*3/uL (ref 0.7–4.0)
MCH: 31.9 pg (ref 26.0–34.0)
MCHC: 33.2 g/dL (ref 30.0–36.0)
MCV: 96.3 fL (ref 80.0–100.0)
Monocytes Absolute: 0.4 10*3/uL (ref 0.1–1.0)
Monocytes Relative: 6 %
Neutro Abs: 4 10*3/uL (ref 1.7–7.7)
Neutrophils Relative %: 60 %
Platelets: 287 10*3/uL (ref 150–400)
RBC: 4.07 MIL/uL (ref 3.87–5.11)
RDW: 12 % (ref 11.5–15.5)
WBC: 6.6 10*3/uL (ref 4.0–10.5)
nRBC: 0 % (ref 0.0–0.2)

## 2020-12-15 LAB — ETHANOL: Alcohol, Ethyl (B): <10 mg/dL (ref <10)

## 2020-12-15 LAB — PREGNANCY, URINE: Preg Test, Ur: NEGATIVE

## 2020-12-15 MED ORDER — SODIUM CHLORIDE 0.9 % IV BOLUS: 1000.0000 mL | Freq: Once | INTRAVENOUS | Status: DC
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
## 2020-12-15 NOTE — Discharge Instructions (Signed)
Please follow up with your primary care provider within 5-7 days for re-evaluation of your symptoms. If you do not have a primary care provider, information for a healthcare clinic has been provided for you to make arrangements for follow up care. Please return to the emergency department for any new or worsening symptoms. ° °

## 2020-12-15 NOTE — ED Notes (Signed)
Pt is refusing IV, fluids and labs.  PA informed.

## 2021-05-05 ENCOUNTER — Ambulatory Visit: Payer: Medicaid Other | Admitting: Orthopedic Surgery

## 2021-12-11 DIAGNOSIS — F411 Generalized anxiety disorder: Secondary | ICD-10-CM | POA: Diagnosis not present

## 2021-12-11 DIAGNOSIS — Z79899 Other long term (current) drug therapy: Secondary | ICD-10-CM | POA: Diagnosis not present

## 2021-12-11 DIAGNOSIS — R4689 Other symptoms and signs involving appearance and behavior: Secondary | ICD-10-CM | POA: Diagnosis not present

## 2021-12-11 DIAGNOSIS — F209 Schizophrenia, unspecified: Secondary | ICD-10-CM | POA: Diagnosis not present

## 2021-12-28 DIAGNOSIS — F209 Schizophrenia, unspecified: Secondary | ICD-10-CM | POA: Diagnosis not present

## 2021-12-28 DIAGNOSIS — R4689 Other symptoms and signs involving appearance and behavior: Secondary | ICD-10-CM | POA: Diagnosis not present

## 2021-12-28 DIAGNOSIS — F411 Generalized anxiety disorder: Secondary | ICD-10-CM | POA: Diagnosis not present

## 2022-01-05 ENCOUNTER — Encounter: Payer: Self-pay | Admitting: Orthopaedic Surgery

## 2022-01-05 ENCOUNTER — Ambulatory Visit: Payer: Medicaid Other | Admitting: Orthopaedic Surgery

## 2022-01-05 VITALS — BP 133/104 | HR 87 | Ht 66.0 in | Wt 160.0 lb

## 2022-01-05 DIAGNOSIS — G5603 Carpal tunnel syndrome, bilateral upper limbs: Secondary | ICD-10-CM | POA: Diagnosis not present

## 2022-01-05 DIAGNOSIS — G5602 Carpal tunnel syndrome, left upper limb: Secondary | ICD-10-CM | POA: Diagnosis not present

## 2022-01-05 DIAGNOSIS — M6281 Muscle weakness (generalized): Secondary | ICD-10-CM | POA: Diagnosis not present

## 2022-01-05 DIAGNOSIS — G5601 Carpal tunnel syndrome, right upper limb: Secondary | ICD-10-CM | POA: Diagnosis not present

## 2022-01-05 DIAGNOSIS — R21 Rash and other nonspecific skin eruption: Secondary | ICD-10-CM | POA: Diagnosis not present

## 2022-01-05 DIAGNOSIS — F909 Attention-deficit hyperactivity disorder, unspecified type: Secondary | ICD-10-CM | POA: Diagnosis not present

## 2022-01-05 DIAGNOSIS — L723 Sebaceous cyst: Secondary | ICD-10-CM | POA: Diagnosis not present
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
## 2022-01-05 NOTE — Patient Instructions (Signed)
Central Scheduling [phone]  [place] [address] [place] PHONE: [phone]  Dr.Newton

## 2022-01-06 ENCOUNTER — Telehealth: Payer: Self-pay | Admitting: Radiology

## 2022-01-06 NOTE — Telephone Encounter (Signed)
I called patient she left sunglasses here yesterday they are at front desk for pick up She asked about Rx for pain I advised her could not send in since she just got subutex yesterday. She states she is not taking, I have advised her can not prescribe for her since it showed up in report, she asked me to ask Dr Hilda Lias again I advised he has indicated he can not prescribe and there is nothing further to offer her for medication take the meds she was prescribed from her other provider.  She was unhappy but voiced understanding.

## 2022-02-13 DIAGNOSIS — F909 Attention-deficit hyperactivity disorder, unspecified type: Secondary | ICD-10-CM | POA: Diagnosis not present

## 2022-02-13 DIAGNOSIS — Z79899 Other long term (current) drug therapy: Secondary | ICD-10-CM | POA: Diagnosis not present

## 2022-02-13 DIAGNOSIS — F411 Generalized anxiety disorder: Secondary | ICD-10-CM | POA: Diagnosis not present

## 2022-02-13 DIAGNOSIS — B3731 Acute candidiasis of vulva and vagina: Secondary | ICD-10-CM | POA: Diagnosis not present

## 2022-03-13 DIAGNOSIS — F411 Generalized anxiety disorder: Secondary | ICD-10-CM | POA: Diagnosis not present

## 2022-03-13 DIAGNOSIS — F909 Attention-deficit hyperactivity disorder, unspecified type: Secondary | ICD-10-CM | POA: Diagnosis not present

## 2022-03-13 DIAGNOSIS — Z79899 Other long term (current) drug therapy: Secondary | ICD-10-CM | POA: Diagnosis not present

## 2022-05-16 IMAGING — CT CT HEAD W/O CM
3 of 4 series · 16 of 47 positions shown, 19 images · non-contrast
Comparison: None.

CLINICAL DATA: Mental status change, alcohol/drug use

EXAM:
CT HEAD WITHOUT CONTRAST
TECHNIQUE: Contiguous axial images were obtained from the base of the skull
through the vertex without intravenous contrast.

[Series 2: head w o · axial · 0.39mm/px · z∈[+815,+935]mm · 10 of 29 slices shown, 13 images]
[im 3/29  brain]
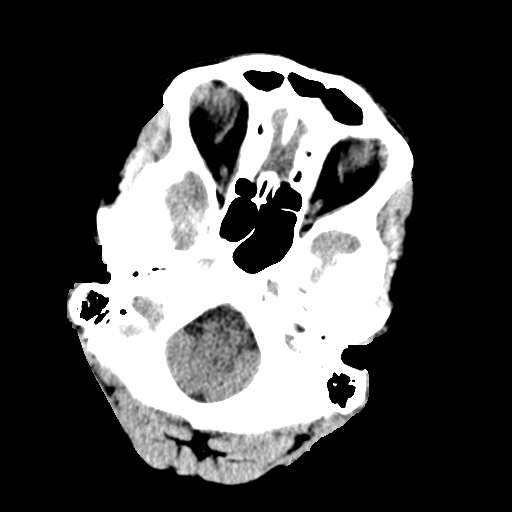
[im 3/29  bone]
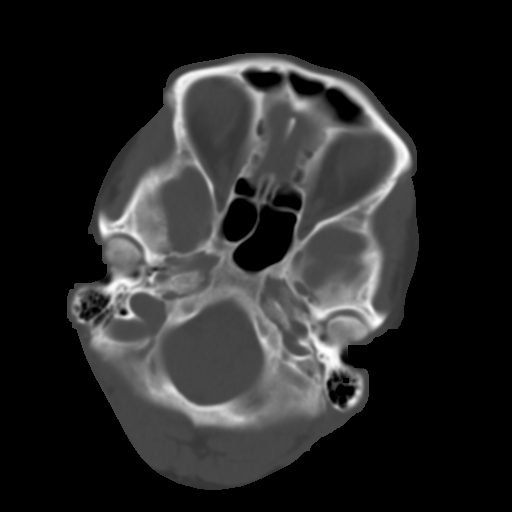
[im 5/29  brain]
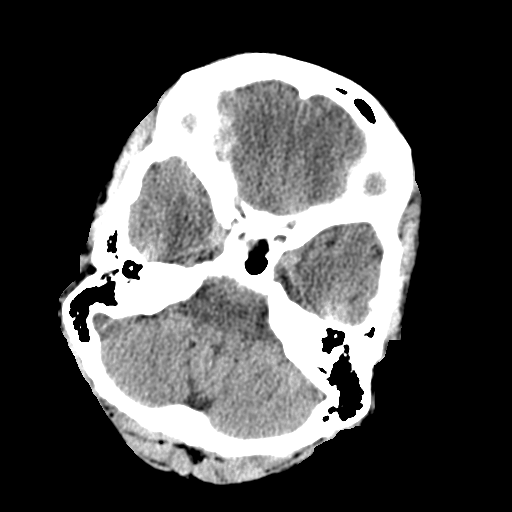
[im 9/29  brain]
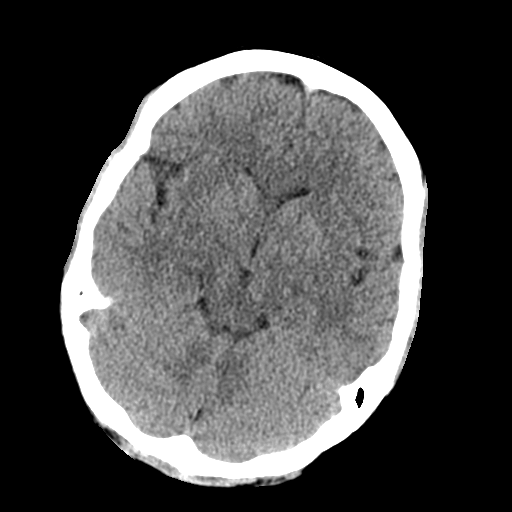
[im 11/29  brain]
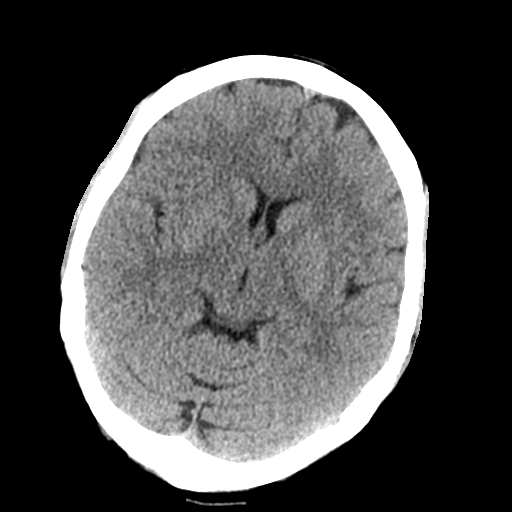
[im 13/29  brain]
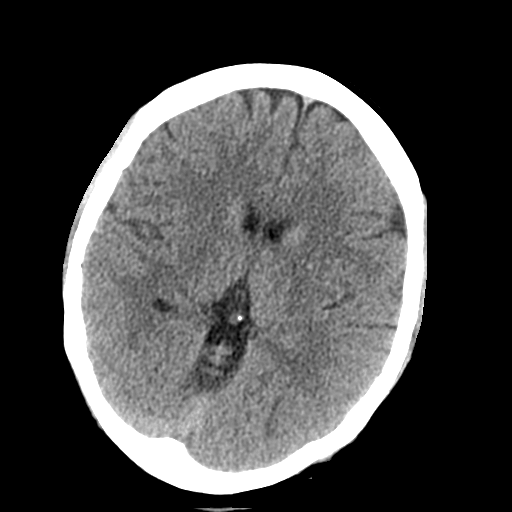
[im 13/29  bone]
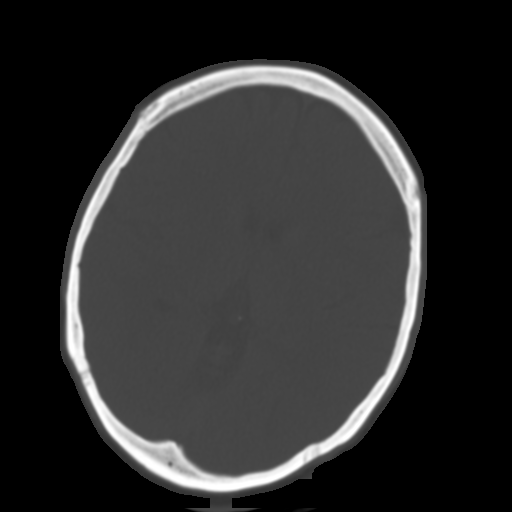
[im 17/29  brain]
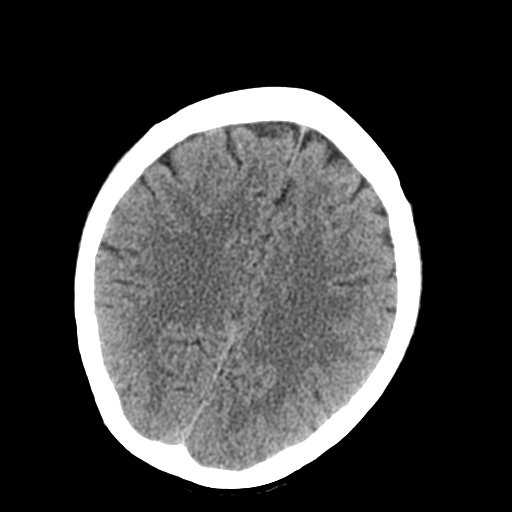
[im 19/29  brain]
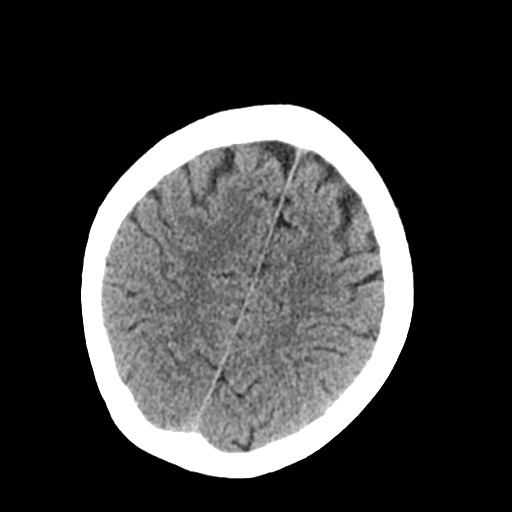
[im 21/29  brain]
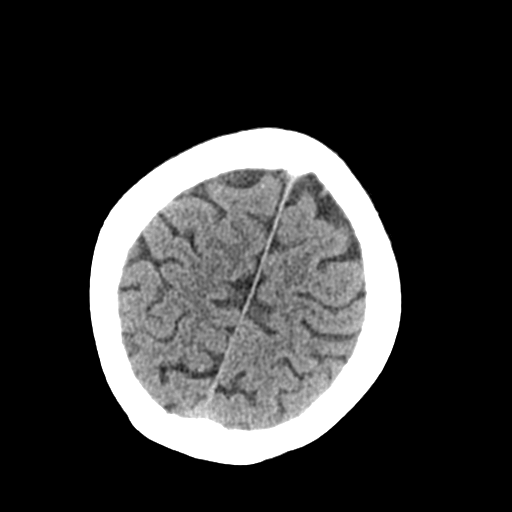
[im 25/29  brain]
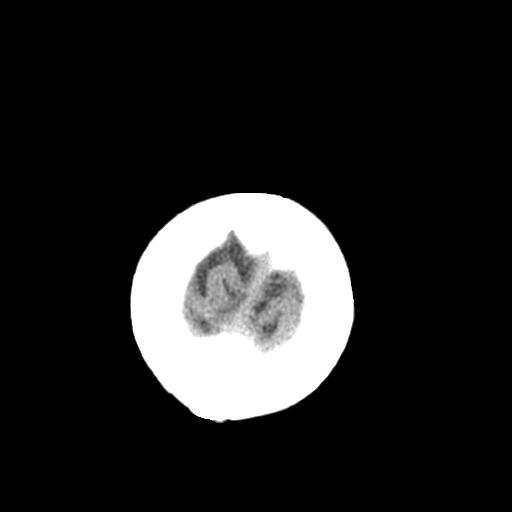
[im 25/29  bone]
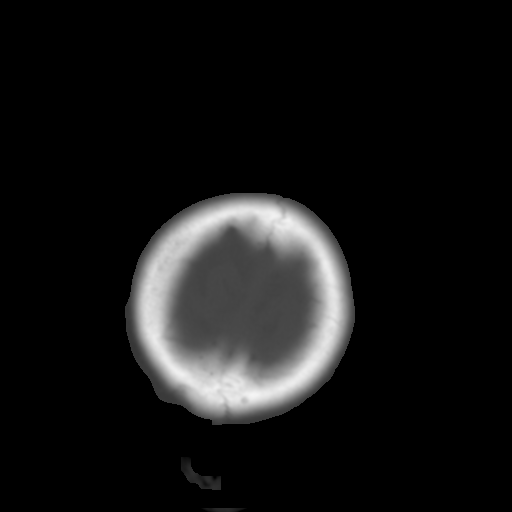
[im 27/29  brain]
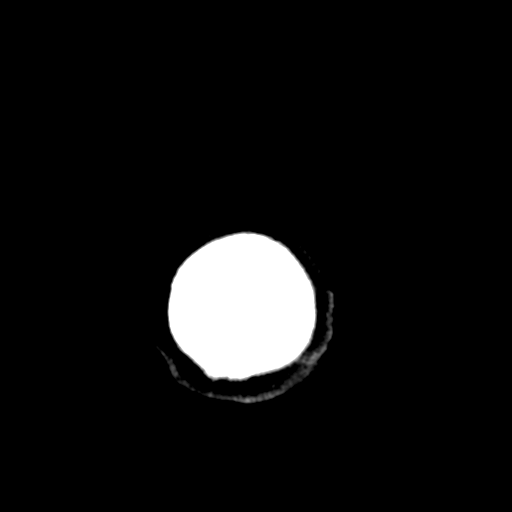

[Series 4: coronal soft · coronal · 0.32mm/px · 3 of 84 slices shown]
[im 28/84  brain]
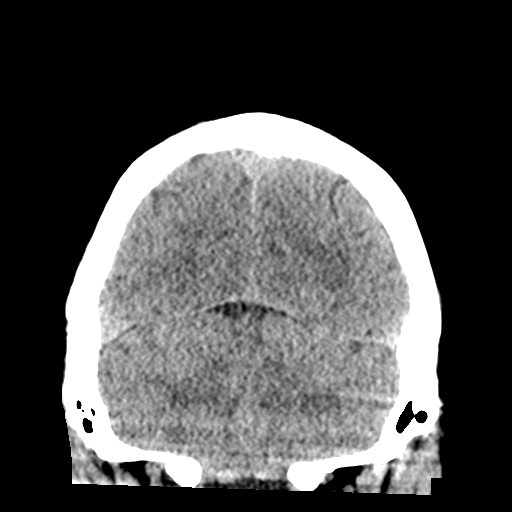
[im 37/84  brain]
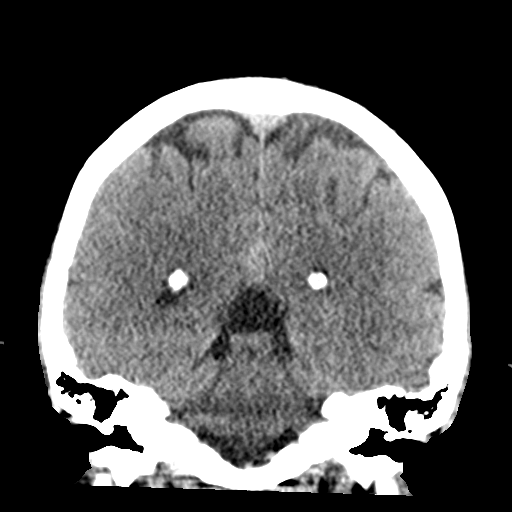
[im 47/84  brain]
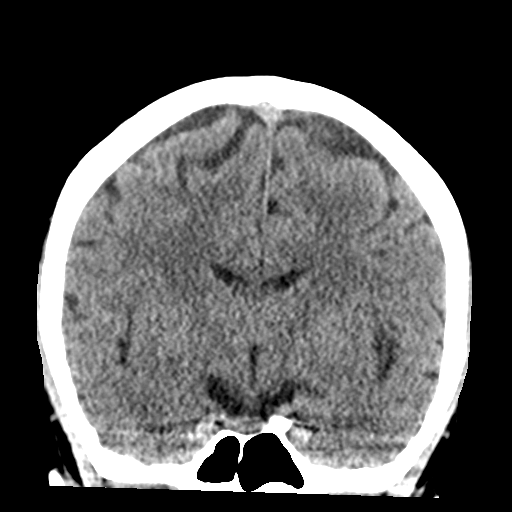

[Series 5: sagittal soft · sagittal · 0.31mm/px · 3 of 55 slices shown]
[im 19/55  brain]
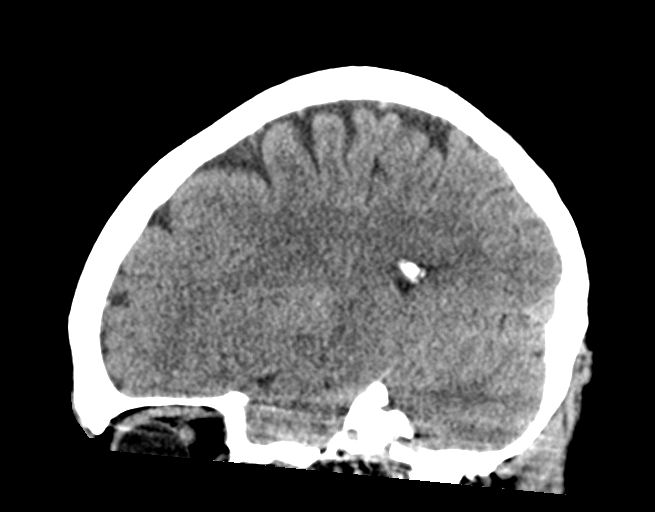
[im 28/55  brain]
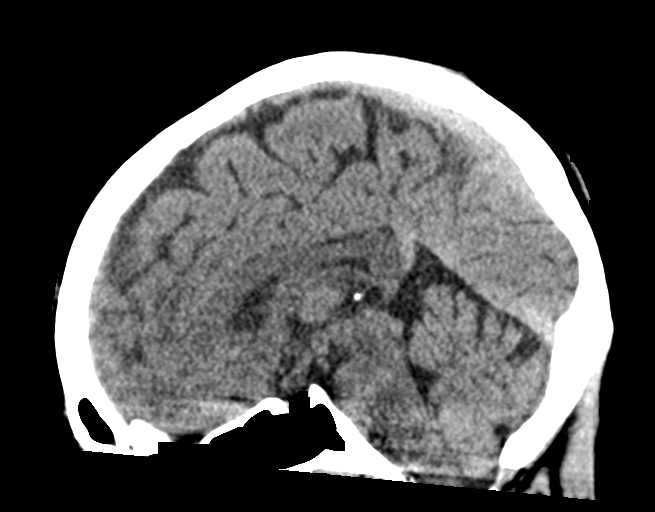
[im 37/55  brain]
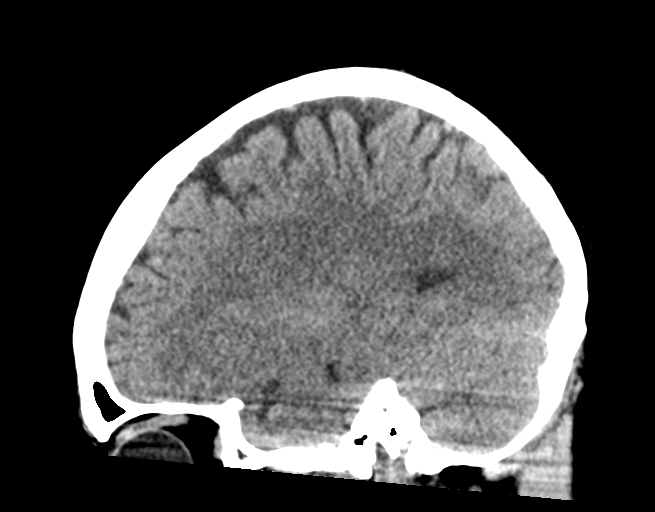

[16 of 47 positions shown; findings below may reference images not displayed]

FINDINGS: Brain: There is no acute intracranial hemorrhage, mass effect, or
edema. Gray-white differentiation is preserved. There is no
extra-axial fluid collection. Ventricles and sulci are within normal
limits in size and configuration.

Vascular: No hyperdense vessel or unexpected calcification.

Skull: Calvarium is unremarkable.

Sinuses/Orbits: Patchy paranasal sinus mucosal thickening. No acute
orbital abnormality.

Other: None.
IMPRESSION: No acute intracranial abnormality.

## 2022-06-14 ENCOUNTER — Telehealth: Payer: Self-pay | Admitting: Orthopaedic Surgery

## 2022-06-14 NOTE — Telephone Encounter (Signed)
Tried to return the patient's call, mailbox is not setup. 

## 2022-06-17 ENCOUNTER — Telehealth: Payer: Self-pay | Admitting: Orthopaedic Surgery

## 2022-06-17 NOTE — Telephone Encounter (Signed)
Spoke w/the patient, she is requesting all her records.  She stated that she has been seeing Dr. Hilda Lias for years.  She needs them for Disability.

## 2022-06-21 NOTE — Telephone Encounter (Signed)
Tried to call the patient and advise, one number doesn't work and the mailbox is not setup on the other one.

## 2022-06-23 NOTE — Telephone Encounter (Signed)
Returned the patient's call, lvm advising what needs to be done for the records.

## 2022-08-31 ENCOUNTER — Other Ambulatory Visit: Payer: Self-pay

## 2022-08-31 ENCOUNTER — Emergency Department (HOSPITAL_COMMUNITY)
Admission: EM | Admit: 2022-08-31 | Discharge: 2022-08-31 | Disposition: A | Discharge status: Home / Self Care | Payer: Medicaid Other | Source: Home / Self Care | Attending: Emergency Medicine | Admitting: Emergency Medicine

## 2022-08-31 DIAGNOSIS — R531 Weakness: Secondary | ICD-10-CM | POA: Diagnosis not present

## 2022-08-31 DIAGNOSIS — X58XXXA Exposure to other specified factors, initial encounter: Secondary | ICD-10-CM | POA: Insufficient documentation

## 2022-08-31 DIAGNOSIS — T192XXA Foreign body in vulva and vagina, initial encounter: Secondary | ICD-10-CM

## 2022-08-31 DIAGNOSIS — R5381 Other malaise: Secondary | ICD-10-CM | POA: Diagnosis not present

## 2022-08-31 NOTE — ED Notes (Signed)
ED Provider at bedside. 

## 2022-08-31 NOTE — ED Notes (Signed)
At bedside  Consent signed by pt, this RN and MD  MD removed 1 bag from vagina Placed in kidney basin Bagged by LE  Pt denies any complaints.  Assisted pt in dressing Vitals listed.

## 2022-08-31 NOTE — ED Provider Notes (Signed)
[place] EMERGENCY DEPARTMENT AT [facility] Provider Note   CSN: [id number] Arrival date & time: 08/31/22  1904     History  Chief Complaint  Patient presents with   Foreign Body in Vagina    Martha Cochran is a 42 y.o. female.   Foreign Body in Vagina   This patient is a 42 year old female, she does have a history of drug use, she has been incarcerated for the last 3 days and when she went to court this morning she was acting abnormal as if she was high on drugs.  They did a body scan at the prison and found that she had some abnormal substance in her vagina.  She refused to let anybody take it out or check her.  She was brought here for exam, they are actively trying to get a search warrant for the legal acquisition of this material.  At this time the patient is refusing to give me any information and does not want to talk    Home Medications Prior to Admission medications   Medication Sig Start Date End Date Taking? Authorizing Provider  amphetamine-dextroamphetamine (ADDERALL) 20 MG tablet Take 20 mg by mouth 2 (two) times daily.      [provider]  buprenorphine (SUBUTEX) 2 MG SUBL SL tablet Place under the tongue.    [provider]  ibuprofen (ADVIL) 800 MG tablet Take 1 tablet (800 mg total) by mouth 3 (three) times daily. 04/30/20   Dartha Lodge, PA-C  naloxone [facility]) nasal spray 4 mg/0.1 mL Use as needed for overdose 01/12/19   Zadie Rhine, MD  QUEtiapine (SEROQUEL) 100 MG tablet Take 100 mg by mouth 2 (two) times daily. 12/28/21   [provider]  clonazePAM (KLONOPIN) 0.5 MG tablet Take 0.5 mg by mouth 2 (two) times daily as needed for anxiety.  01/12/19  [provider]      Allergies    Penicillins, Tramadol, and Amoxicillin    Review of Systems   Review of Systems  All other systems reviewed and are negative.   Physical Exam Updated Vital Signs BP 104/69 (BP Location: Right Arm)   Pulse 95    Temp 98.5 F (36.9 C) (Oral)   Resp 16   SpO2 96%  Physical Exam Vitals and nursing note reviewed.  Constitutional:      General: She is not in acute distress.    Appearance: She is well-developed.  HENT:     Head: Normocephalic and atraumatic.     Mouth/Throat:     Pharynx: No oropharyngeal exudate.  Eyes:     General: No scleral icterus.       Right eye: No discharge.        Left eye: No discharge.     Conjunctiva/sclera: Conjunctivae normal.     Pupils: Pupils are equal, round, and reactive to light.  Neck:     Thyroid: No thyromegaly.     Vascular: No JVD.  Cardiovascular:     Rate and Rhythm: Normal rate and regular rhythm.     Heart sounds: Normal heart sounds. No murmur heard.    No friction rub. No gallop.  Pulmonary:     Effort: Pulmonary effort is normal. No respiratory distress.     Breath sounds: Normal breath sounds. No wheezing or rales.  Abdominal:     General: Bowel sounds are normal. There is no distension.     Palpations: Abdomen is soft. There is no mass.  Tenderness: There is no abdominal tenderness.  Musculoskeletal:        General: No tenderness. Normal range of motion.     Cervical back: Normal range of motion and neck supple.     Right lower leg: No edema.     Left lower leg: No edema.  Lymphadenopathy:     Cervical: No cervical adenopathy.  Skin:    General: Skin is warm and dry.     Findings: No erythema or rash.  Neurological:     Mental Status: She is alert.     Coordination: Coordination normal.  Psychiatric:     Comments: Refusing to answer questions, has a flat affect     ED Results / Procedures / Treatments   Labs (all labs ordered are listed, but only abnormal results are displayed) Labs Reviewed - No data to display  EKG None  Radiology No results found.  Procedures .Foreign Body Removal  Date/Time: 08/31/2022 9:10 PM  Performed by: Eber Hong, MD Authorized by: Eber Hong, MD  Consent: Verbal consent  obtained. Written consent obtained. Risks and benefits: risks, benefits and alternatives were discussed Consent given by: patient Required items: required blood products, implants, devices, and special equipment available Patient identity confirmed: verbally with patient Time out: Immediately prior to procedure a "time out" was called to verify the correct patient, procedure, equipment, support staff and site/side marked as required. Body area: vagina  Sedation: Patient sedated: no  Patient restrained: no Patient cooperative: yes Localization method: speculum Removal mechanism: ring forceps Complexity: simple 1 objects recovered. Objects recovered: bag of foreign substance Post-procedure assessment: foreign body removed Patient tolerance: patient tolerated the procedure well with no immediate complications Comments:        Medications Ordered in ED Medications - No data to display  ED Course/ Medical Decision Making/ A&P                             Medical Decision Making  Awaiting search warrant, patient appears to be hemodynamically stable  The patient had refused the pelvic exam however an officer showed up whom the patient Trusted and she has now given consent for the exam.  The patient given signed consent, she was placed in the supine position, pelvic exam was performed with nurse at the bedside, Bobbie.  Patient had a normal-appearing external genitalia, speculum was inserted and foreign body was found, this was a clear plastic bag at the back of the vaginal vault.  This was removed with ring forceps without any obvious spillage of material.  The vagina was then inspected afterwards and there was nothing left in there.  The patient is awake and alert, she will be observed for couple of hours prior to being discharged to jail to make sure that there is no other systemic absorption.  Materials were given to the police officer at the bedside to be placed into their direct  position as evidence.        Final Clinical Impression(s) / ED Diagnoses Final diagnoses:  Vaginal foreign body, initial encounter     Eber Hong, MD 08/31/22 2304

## 2022-08-31 NOTE — ED Triage Notes (Signed)
Pt came in PD custody In jail x3 days Arrived to court [place] this morning Informed that at jail a body scan showed that pt has something in vagina  Pt refusing tx. PD getting warrant to retreive then DC back to jail  Pt denies CP, SOB Pt denies, HA, blurred dizziness Denies ABD pain n/v/d Peeing normal Last BM 3 weeks ago  Pt admitted to using fentanyl and meth "Anything I can get my hands on"

## 2022-08-31 NOTE — Discharge Instructions (Signed)
Please do not put anything in your vagina that does not belong there  Especially drugs

## 2022-08-31 NOTE — ED Notes (Signed)
Pt is alert and remains pain free Pt denies any breathing difficulty  Vitals assessed and listed

## 2022-08-31 NOTE — ED Notes (Signed)
Pt stated that she would allow for a pelvic  Pt requested to urinate Pt stated that she has been unable to urinate since she placed fentanyl in vagina Pt pulled package out of vagina herself and urinated on bedpan.  Pt also signed consent to have FBO retrieved from vagina.  Informed MD FBO in bag given LE at bedside
